# Patient Record
Sex: Male | Born: 2000 | Race: Black or African American | Hispanic: No | Marital: Single | Smoking: Current some day smoker
Health system: Southern US, Community
[De-identification: ages and names within clinical notes are randomized; demographics above are authoritative.]

## PROBLEM LIST (undated history)

## (undated) DIAGNOSIS — F909 Attention-deficit hyperactivity disorder, unspecified type: Secondary | ICD-10-CM

## (undated) DIAGNOSIS — F913 Oppositional defiant disorder: Secondary | ICD-10-CM

## (undated) DIAGNOSIS — J45909 Unspecified asthma, uncomplicated: Secondary | ICD-10-CM

## (undated) HISTORY — DX: Unspecified asthma, uncomplicated: J45.909

## (undated) HISTORY — DX: Attention-deficit hyperactivity disorder, unspecified type: F90.9

## (undated) HISTORY — PX: OTHER SURGICAL HISTORY: SHX169

## (undated) HISTORY — DX: Oppositional defiant disorder: F91.3

---
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
## 2021-09-30 DIAGNOSIS — F439 Reaction to severe stress, unspecified: Secondary | ICD-10-CM | POA: Diagnosis not present

## 2021-10-11 DIAGNOSIS — F439 Reaction to severe stress, unspecified: Secondary | ICD-10-CM | POA: Diagnosis not present

## 2021-11-04 DIAGNOSIS — F439 Reaction to severe stress, unspecified: Secondary | ICD-10-CM | POA: Diagnosis not present

## 2021-11-15 DIAGNOSIS — F439 Reaction to severe stress, unspecified: Secondary | ICD-10-CM | POA: Diagnosis not present

## 2022-05-02 ENCOUNTER — Ambulatory Visit
Admission: RE | Admit: 2022-05-02 | Discharge: 2022-05-02 | Disposition: A | Discharge status: Home / Self Care | Payer: Medicaid Other | Source: Ambulatory Visit | Attending: Family Medicine | Admitting: Family Medicine

## 2022-05-02 VITALS — BP 127/72 | HR 68 | Temp 97.2°F | Resp 16

## 2022-05-02 DIAGNOSIS — L42 Pityriasis rosea: Secondary | ICD-10-CM | POA: Diagnosis not present

## 2022-05-02 NOTE — ED Triage Notes (Signed)
Patient presents with UC with rash x 1 week. Pt states generalized rash, treating with calamine lotion.

## 2022-05-02 NOTE — ED Provider Notes (Signed)
[facility]    CSN: [id number] Arrival date & time: 05/02/22  1350      History   Chief Complaint Chief Complaint  Patient presents with   Rash   Urticaria    HPI Phillip Dixon is a 21 y.o. male.   Patient here today for evaluation of rash that he has had for the last week.  He is not sure if rash started on abdomen or back but states it is now spread to entire trunk and arms.  He notes that rash is itchy.  He has been using calamine lotion without resolution.  He was concerned that rash may be related to using Dawn dish soap to wash his body when he ran out of body wash.  The history is provided by the patient.  Rash Associated symptoms: no fever   Urticaria    Past Medical History:  Diagnosis Date   ADHD (attention deficit hyperactivity disorder)    Asthma    Oppositional defiant disorder     Patient Active Problem List   Diagnosis Date Noted   Abrasion of sclera 09/24/2018   Oppositional defiant disorder 04/28/2014   DENTAL PAIN 06/20/2010   ALLERGIC RHINITIS 04/22/2010   Asthma 04/14/2010   ADHD (attention deficit hyperactivity disorder), combined type 01/17/2009    Past Surgical History:  Procedure Laterality Date   tonsillectomy         Home Medications    Prior to Admission medications   Medication Sig Start Date End Date Taking? Authorizing Provider  hydroxypropyl methylcellulose / hypromellose (ISOPTO TEARS / GONIOVISC) 2.5 % ophthalmic solution Place 1 drop into the right eye as needed for dry eyes. 09/24/18   Shirley, [place], DO    Family History No family history on file.  Social History Social History   Tobacco Use   Smoking status: Some Days    Types: Cigarettes, Cigars   Smokeless tobacco: Never  Vaping Use   Vaping Use: Every day  Substance Use Topics   Alcohol use: Yes   Drug use: Yes    Types: Marijuana     Allergies   Patient has no known allergies.   Review of Systems Review of Systems   Constitutional:  Negative for chills and fever.  Eyes:  Negative for discharge and redness.  Skin:  Positive for rash.  Neurological:  Negative for numbness.     Physical Exam Triage Vital Signs ED Triage Vitals  Enc Vitals Group     BP 05/02/22 1424 127/72     Pulse Rate 05/02/22 1424 68     Resp 05/02/22 1424 16     Temp 05/02/22 1424 (!) 97.2 F (36.2 C)     Temp Source 05/02/22 1424 Oral     SpO2 05/02/22 1424 97 %     Weight --      Height --      Head Circumference --      Peak Flow --      Pain Score 05/02/22 1426 0     Pain Loc --      Pain Edu? --      Excl. in [place]? --    No data found.  Updated Vital Signs BP 127/72 (BP Location: Left Arm)   Pulse 68   Temp (!) 97.2 F (36.2 C) (Oral)   Resp 16   SpO2 97%      Physical Exam Vitals and nursing note reviewed.  Constitutional:      General: He is  not in acute distress.    Appearance: Normal appearance. He is not ill-appearing.  HENT:     Head: Normocephalic and atraumatic.  Eyes:     Conjunctiva/sclera: Conjunctivae normal.  Cardiovascular:     Rate and Rhythm: Normal rate.  Pulmonary:     Effort: Pulmonary effort is normal. No respiratory distress.  Skin:    Findings: Rash (diffuse mildly erythematous maculopapular rash with some dry appearing plaque-like lesions) present.  Neurological:     Mental Status: He is alert.  Psychiatric:        Mood and Affect: Mood normal.        Behavior: Behavior normal.        Thought Content: Thought content normal.      UC Treatments / Results  Labs (all labs ordered are listed, but only abnormal results are displayed) Labs Reviewed - No data to display  EKG   Radiology No results found.  Procedures Procedures (including critical care time)  Medications Ordered in UC Medications - No data to display  Initial Impression / Assessment and Plan / UC Course  I have reviewed the triage vital signs and the nursing notes.  Pertinent labs & imaging  results that were available during my care of the patient were reviewed by me and considered in my medical decision making (see chart for details).    Rash seemingly consistent with pityriasis rosea.  Discussed benign nature of same and recommended Benadryl if needed for itching.  Discussed that rash will clear without further treatment.  Encouraged follow-up with any further concerns.  Final Clinical Impressions(s) / UC Diagnoses   Final diagnoses:  Pityriasis rosea   Discharge Instructions   None    ED Prescriptions   None    PDMP not reviewed this encounter.   Tomi Bamberger, PA-C 05/02/22 [phone]

## 2023-07-09 ENCOUNTER — Ambulatory Visit: Payer: Self-pay | Admitting: Nurse Practitioner

## 2023-11-05 ENCOUNTER — Other Ambulatory Visit: Payer: Self-pay

## 2023-11-05 ENCOUNTER — Encounter (HOSPITAL_COMMUNITY): Payer: Self-pay

## 2023-11-05 ENCOUNTER — Emergency Department (EMERGENCY_DEPARTMENT_HOSPITAL)
Admission: EM | Admit: 2023-11-05 | Discharge: 2023-11-05 | Disposition: A | Discharge status: Other Acute Inpatient Hospital | Source: Home / Self Care | Attending: Emergency Medicine | Admitting: Emergency Medicine

## 2023-11-05 ENCOUNTER — Encounter (HOSPITAL_COMMUNITY): Payer: Self-pay | Admitting: Psychiatry

## 2023-11-05 ENCOUNTER — Inpatient Hospital Stay (HOSPITAL_COMMUNITY)
Admission: AD | Admit: 2023-11-05 | Discharge: 2023-11-08 | DRG: 885 | Disposition: A | Discharge status: Home / Self Care | Source: Intra-hospital | Attending: Psychiatry | Admitting: Psychiatry

## 2023-11-05 DIAGNOSIS — T39311A Poisoning by propionic acid derivatives, accidental (unintentional), initial encounter: Secondary | ICD-10-CM | POA: Insufficient documentation

## 2023-11-05 DIAGNOSIS — T887XXA Unspecified adverse effect of drug or medicament, initial encounter: Secondary | ICD-10-CM | POA: Diagnosis not present

## 2023-11-05 DIAGNOSIS — F902 Attention-deficit hyperactivity disorder, combined type: Secondary | ICD-10-CM | POA: Diagnosis present

## 2023-11-05 DIAGNOSIS — Y901 Blood alcohol level of 20-39 mg/100 ml: Secondary | ICD-10-CM | POA: Diagnosis present

## 2023-11-05 DIAGNOSIS — J45909 Unspecified asthma, uncomplicated: Secondary | ICD-10-CM | POA: Insufficient documentation

## 2023-11-05 DIAGNOSIS — F909 Attention-deficit hyperactivity disorder, unspecified type: Secondary | ICD-10-CM | POA: Insufficient documentation

## 2023-11-05 DIAGNOSIS — F329 Major depressive disorder, single episode, unspecified: Secondary | ICD-10-CM

## 2023-11-05 DIAGNOSIS — E876 Hypokalemia: Secondary | ICD-10-CM | POA: Diagnosis not present

## 2023-11-05 DIAGNOSIS — F10129 Alcohol abuse with intoxication, unspecified: Secondary | ICD-10-CM | POA: Diagnosis present

## 2023-11-05 DIAGNOSIS — F332 Major depressive disorder, recurrent severe without psychotic features: Secondary | ICD-10-CM

## 2023-11-05 DIAGNOSIS — Z5941 Food insecurity: Secondary | ICD-10-CM | POA: Diagnosis not present

## 2023-11-05 DIAGNOSIS — F10121 Alcohol abuse with intoxication delirium: Secondary | ICD-10-CM | POA: Diagnosis not present

## 2023-11-05 DIAGNOSIS — I10 Essential (primary) hypertension: Secondary | ICD-10-CM | POA: Diagnosis not present

## 2023-11-05 DIAGNOSIS — F1210 Cannabis abuse, uncomplicated: Secondary | ICD-10-CM | POA: Diagnosis present

## 2023-11-05 DIAGNOSIS — Z9152 Personal history of nonsuicidal self-harm: Secondary | ICD-10-CM

## 2023-11-05 DIAGNOSIS — T50904A Poisoning by unspecified drugs, medicaments and biological substances, undetermined, initial encounter: Secondary | ICD-10-CM

## 2023-11-05 DIAGNOSIS — Z79899 Other long term (current) drug therapy: Secondary | ICD-10-CM

## 2023-11-05 DIAGNOSIS — F411 Generalized anxiety disorder: Secondary | ICD-10-CM | POA: Diagnosis present

## 2023-11-05 DIAGNOSIS — Z5902 Unsheltered homelessness: Secondary | ICD-10-CM | POA: Diagnosis not present

## 2023-11-05 DIAGNOSIS — R9431 Abnormal electrocardiogram [ECG] [EKG]: Secondary | ICD-10-CM | POA: Diagnosis not present

## 2023-11-05 LAB — URINALYSIS, ROUTINE W REFLEX MICROSCOPIC
Bilirubin Urine: NEGATIVE
Glucose, UA: NEGATIVE mg/dL
Hgb urine dipstick: NEGATIVE
Ketones, ur: NEGATIVE mg/dL
Leukocytes,Ua: NEGATIVE
Nitrite: NEGATIVE
Protein, ur: NEGATIVE mg/dL
Specific Gravity, Urine: 1.013 (ref 1.005–1.030)
pH: 7 (ref 5.0–8.0)

## 2023-11-05 LAB — CBC WITH DIFFERENTIAL/PLATELET
Abs Immature Granulocytes: 0.02 10*3/uL (ref 0.00–0.07)
Basophils Absolute: 0 10*3/uL (ref 0.0–0.1)
Basophils Relative: 0 %
Eosinophils Absolute: 0.1 10*3/uL (ref 0.0–0.5)
Eosinophils Relative: 1 %
HCT: 44.8 % (ref 39.0–52.0)
Hemoglobin: 14.6 g/dL (ref 13.0–17.0)
Immature Granulocytes: 0 %
Lymphocytes Relative: 31 %
Lymphs Abs: 2.6 10*3/uL (ref 0.7–4.0)
MCH: 29.9 pg (ref 26.0–34.0)
MCHC: 32.6 g/dL (ref 30.0–36.0)
MCV: 91.6 fL (ref 80.0–100.0)
Monocytes Absolute: 0.7 10*3/uL (ref 0.1–1.0)
Monocytes Relative: 8 %
Neutro Abs: 5 10*3/uL (ref 1.7–7.7)
Neutrophils Relative %: 60 %
Platelets: 284 10*3/uL (ref 150–400)
RBC: 4.89 MIL/uL (ref 4.22–5.81)
RDW: 12.4 % (ref 11.5–15.5)
WBC: 8.4 10*3/uL (ref 4.0–10.5)
nRBC: 0 % (ref 0.0–0.2)

## 2023-11-05 LAB — COMPREHENSIVE METABOLIC PANEL WITH GFR
ALT: 14 U/L (ref 0–44)
AST: 21 U/L (ref 15–41)
Albumin: 4.2 g/dL (ref 3.5–5.0)
Alkaline Phosphatase: 68 U/L (ref 38–126)
Anion gap: 10 (ref 5–15)
BUN: 12 mg/dL (ref 6–20)
CO2: 22 mmol/L (ref 22–32)
Calcium: 8.7 mg/dL — ABNORMAL LOW (ref 8.9–10.3)
Chloride: 103 mmol/L (ref 98–111)
Creatinine, Ser: 0.75 mg/dL (ref 0.61–1.24)
GFR, Estimated: >60 mL/min (ref >60)
Glucose, Bld: 95 mg/dL (ref 70–99)
Potassium: 3.4 mmol/L — ABNORMAL LOW (ref 3.5–5.1)
Sodium: 135 mmol/L (ref 135–145)
Total Bilirubin: 0.8 mg/dL (ref 0.0–1.2)
Total Protein: 7.4 g/dL (ref 6.5–8.1)

## 2023-11-05 LAB — RAPID URINE DRUG SCREEN, HOSP PERFORMED
Amphetamines: NOT DETECTED
Barbiturates: NOT DETECTED
Benzodiazepines: NOT DETECTED
Cocaine: NOT DETECTED
Opiates: NOT DETECTED
Tetrahydrocannabinol: POSITIVE — AB

## 2023-11-05 LAB — SALICYLATE LEVEL: Salicylate Lvl: <7 mg/dL — ABNORMAL LOW (ref 7.0–30.0)

## 2023-11-05 LAB — ETHANOL: Alcohol, Ethyl (B): 30 mg/dL — ABNORMAL HIGH (ref <15)

## 2023-11-05 LAB — ACETAMINOPHEN LEVEL: Acetaminophen (Tylenol), Serum: <10 ug/mL — ABNORMAL LOW (ref 10–30)

## 2023-11-05 MED ORDER — NICOTINE 7 MG/24HR TD PT24
7.0000 mg | MEDICATED_PATCH | Freq: Every day | TRANSDERMAL | Status: DC
  Filled 2023-11-05: qty 1

## 2023-11-05 MED ORDER — DIPHENHYDRAMINE HCL 25 MG PO CAPS: 50.0000 mg | ORAL_CAPSULE | Freq: Three times a day (TID) | ORAL | Status: DC | PRN

## 2023-11-05 MED ORDER — HYDROXYZINE HCL 25 MG PO TABS: 25.0000 mg | ORAL_TABLET | Freq: Three times a day (TID) | ORAL | Status: DC | PRN

## 2023-11-05 MED ORDER — STERILE WATER FOR INJECTION IJ SOLN
INTRAMUSCULAR | Status: AC
  Filled 2023-11-05: qty 10

## 2023-11-05 MED ORDER — LORAZEPAM 2 MG/ML IJ SOLN: 2.0000 mg | Freq: Three times a day (TID) | INTRAMUSCULAR | Status: DC | PRN

## 2023-11-05 MED ORDER — HALOPERIDOL 5 MG PO TABS: 5.0000 mg | ORAL_TABLET | Freq: Three times a day (TID) | ORAL | Status: DC | PRN

## 2023-11-05 MED ORDER — ZIPRASIDONE MESYLATE 20 MG IM SOLR
INTRAMUSCULAR | Status: AC
  Filled 2023-11-05: qty 20

## 2023-11-05 MED ORDER — ALUM & MAG HYDROXIDE-SIMETH 200-200-20 MG/5ML PO SUSP: 30.0000 mL | Freq: Four times a day (QID) | ORAL | Status: DC | PRN

## 2023-11-05 MED ORDER — ACETAMINOPHEN 325 MG PO TABS
650.0000 mg | ORAL_TABLET | Freq: Four times a day (QID) | ORAL | Status: DC | PRN
  Administered 2023-11-05: 650 mg via ORAL
  Filled 2023-11-05: qty 2

## 2023-11-05 MED ORDER — ACETAMINOPHEN 325 MG PO TABS: 650.0000 mg | ORAL_TABLET | ORAL | Status: DC | PRN

## 2023-11-05 MED ORDER — POTASSIUM CHLORIDE CRYS ER 20 MEQ PO TBCR
40.0000 meq | EXTENDED_RELEASE_TABLET | Freq: Once | ORAL | Status: AC
  Administered 2023-11-05: 40 meq via ORAL
  Filled 2023-11-05: qty 2

## 2023-11-05 MED ORDER — DIPHENHYDRAMINE HCL 50 MG/ML IJ SOLN: 50.0000 mg | Freq: Three times a day (TID) | INTRAMUSCULAR | Status: DC | PRN

## 2023-11-05 MED ORDER — ZIPRASIDONE MESYLATE 20 MG IM SOLR
10.0000 mg | Freq: Once | INTRAMUSCULAR | Status: AC | PRN
  Administered 2023-11-05: 10 mg via INTRAMUSCULAR

## 2023-11-05 MED ORDER — ALUM & MAG HYDROXIDE-SIMETH 200-200-20 MG/5ML PO SUSP: 30.0000 mL | ORAL | Status: DC | PRN

## 2023-11-05 MED ORDER — TRAZODONE HCL 50 MG PO TABS
50.0000 mg | ORAL_TABLET | Freq: Every evening | ORAL | Status: DC | PRN
  Administered 2023-11-05 – 2023-11-07 (×2): 50 mg via ORAL
  Filled 2023-11-05 (×2): qty 1

## 2023-11-05 MED ORDER — HYDROXYZINE HCL 25 MG PO TABS
25.0000 mg | ORAL_TABLET | Freq: Three times a day (TID) | ORAL | Status: DC | PRN
  Administered 2023-11-05: 25 mg via ORAL
  Filled 2023-11-05: qty 1

## 2023-11-05 MED ORDER — MAGNESIUM HYDROXIDE 400 MG/5ML PO SUSP: 30.0000 mL | Freq: Every day | ORAL | Status: DC | PRN

## 2023-11-05 MED ORDER — HALOPERIDOL LACTATE 5 MG/ML IJ SOLN: 5.0000 mg | Freq: Three times a day (TID) | INTRAMUSCULAR | Status: DC | PRN

## 2023-11-05 MED ORDER — ONDANSETRON HCL 4 MG PO TABS: 4.0000 mg | ORAL_TABLET | Freq: Three times a day (TID) | ORAL | Status: DC | PRN

## 2023-11-05 MED ORDER — ZIPRASIDONE MESYLATE 20 MG IM SOLR: 10.0000 mg | Freq: Once | INTRAMUSCULAR | Status: DC | PRN

## 2023-11-05 MED ORDER — HALOPERIDOL LACTATE 5 MG/ML IJ SOLN: 10.0000 mg | Freq: Three times a day (TID) | INTRAMUSCULAR | Status: DC | PRN

## 2023-11-05 NOTE — ED Notes (Signed)
 Writer rounded on patient. Pt asleep. Respirations even and unlabored.

## 2023-11-05 NOTE — Consult Note (Signed)
 [facility] Psychiatric Consult Initial  Patient Name: .Phillip Dixon  MRN: [id number]  DOB: May 08, 2001  Consult Order details:  Orders (From admission, onward)     Start     Ordered   11/05/23 0211  CONSULT TO CALL ACT TEAM       Ordering Provider: Alissa April, MD  Provider:  (Not yet assigned)  Question:  Reason for Consult?  Answer:  Psych consult   11/05/23 0210             Mode of Visit: In person    Psychiatry Consult Evaluation  Service Date: Dixon 24, 2025 LOS:  LOS: 0 days  Chief Complaint "patient took 200 mg ibuprofen, and some tylenol ."   Primary Psychiatric Diagnoses  Major depressive disorder, unspecified 2.   GAD 3.   ADHD  Assessment  Phillip Dixon is a 23 y.o. male admitted: Presented to the ED on 11/05/2023 12:30 AM for overdose. He carries the psychiatric diagnoses of MDD, GAD, and ADHD and has a past medical history of none.   His current presentation of anhedonia, aggression, impulsivity, and hopelessness is most consistent with decompensating ADHD and depression. He meets criteria for inpatient admission based on the current symptoms.  Current outpatient psychotropic medications include none. On initial examination, patient is cooperative, but focused on being discharged. Please see plan below for detailed recommendations.   Diagnoses:  Active Hospital problems: Principal Problem:   MDD (major depressive disorder) Active Problems:   ADHD (attention deficit hyperactivity disorder), combined type    Plan   ## Psychiatric Medication Recommendations:  Give Atarax  25 mg PO TID PRN for anxiety  ## Medical Decision Making Capacity: Not specifically addressed in this encounter  ## Further Work-up:  -- No further work up needed at this time  EKG or UDS -- most recent EKG on 11/05/23 had QtC of 417 -- Pertinent labwork reviewed earlier this admission includes: CBC, CMP, EKG   ## Disposition:-- We recommend inpatient psychiatric  hospitalization after medical hospitalization. Patient has been involuntarily committed on 11/05/23.   ## Behavioral / Environmental: -To minimize splitting of staff, assign one staff person to communicate all information from the team when feasible. or Utilize compassion and acknowledge the patient's experiences while setting clear and realistic expectations for care.    ## Safety and Observation Level:  - Based on my clinical evaluation, I estimate the patient to be at low risk of self harm in the current setting. - At this time, we recommend  routine. This decision is based on my review of the chart including patient's history and current presentation, interview of the patient, mental status examination, and consideration of suicide risk including evaluating suicidal ideation, plan, intent, suicidal or self-harm behaviors, risk factors, and protective factors. This judgment is based on our ability to directly address suicide risk, implement suicide prevention strategies, and develop a safety plan while the patient is in the clinical setting. Please contact our team if there is a concern that risk level has changed.  CSSR Risk Category:C-SSRS RISK CATEGORY: Low Risk  Suicide Risk Assessment: Patient has following modifiable risk factors for suicide: active suicidal ideation, untreated depression, and recklessness, which we are addressing by recommending inpatient psychiatric admission. Patient has following non-modifiable or demographic risk factors for suicide: male gender, history of suicide attempt, and history of self harm behavior Patient has the following protective factors against suicide: Supportive friends  Thank you for this consult request. Recommendations have been communicated to the primary  team.  We will recommend inpatient psychiatric admission at this time.   Phillip Dixon, PMHNP       History of Present Illness  Relevant Aspects of Hospital ED Course:  Admitted on  11/05/2023 for attempting overdose patient took 200 mg ibuprofen, and some tylenol ."   Patient Report:  Phillip Dixon, 23 y.o., male patient seen face to face by this provider, consulted with Dr. Deborah Dixon; and chart reviewed on 11/05/23.  On evaluation Phillip Dixon reports that he was not trying to harm himself, but states he was trying to "numb the pain" of his girlfriend breaking up with him. Patient girlfriend Phillip Dixon is here visiting patient. Patient becomes upset when this provider informs him, he is recommended for inpatient admission, based on the attempted overdose. Patient is focused on being discharged, and leaving the hospital, as he states he has a job he has to get back to. This provider discusses with him consequences of the actions we make, patient continues to say he just wanted to numb the pain, and states he takes Tylenol  all the time, but could not say why he was taking Tylenol . Patient states he is homeless, and he needs his job, he confirms he works part time at [employer]. He states his appetite and sleep are fair. Denies ever being admitted to an inpatient psychiatric facility, but states he has seen a therapist before (years ago) and was diagnosed with depression and ADHD. Patient states he does not take medication, and says when he took medication for ADHD it made him lose his appetite and he did not continue to take it.   During evaluation Phillip Dixon is in the milieu sitting in a chair, talking and appears to be in no acute distress. He is alert, oriented x 3, calm, cooperative and attentive. Her mood is irritable, but able to calm down throughout the assessment with congruent affect.  He has normal speech, and behavior.  Objectively there is no evidence of psychosis/mania or delusional thinking.  Patient is able to converse coherently, goal directed thoughts, no distractibility, or pre-occupation. He currently denies suicidal/self-harm/homicidal ideation, psychosis, and paranoia.   Per Chart Review patient was seen in the ED in 07/2016 due to SI and HI thoughts. Patient has a past hx of cutting. Patient UDS positive for THC and BAL is 30 on admission.    Psych ROS:  Depression: Positive  Anxiety:  Positive  Mania (lifetime and current): Denies  Psychosis: (lifetime and current): Denies   Collateral information:  With patient permission, spoke with his girlfriend [name], she states she has known patient about 8 yrs since high school, says he has always been very hyper, irritable, trouble focusing, and depressed for a very long time. She agrees that patient needs help with mental health treatment. She states he has been homeless, she lives with her parents and he sleeps in her car, and he drinks alcohol a lot, while taking over the counter pain medications, and THC. She states he was raised by his paternal aunt [name], his biological mother lives in [place], and his bio father lives here in [place] in a boarding house. She states that patients family does not believe in mental health help or treatment. She states the only real support patient had in his family was his paternal grandfather who passed away when patient was 8 yrs old. She states he face timed her last night, and was walking in traffic after she broke up with him, because she states  they need to focus on self, and get individual help, she feels she has enabled him and he wont let her break up with him.    Review of Systems  Psychiatric/Behavioral:  Positive for depression, substance abuse and suicidal ideas.      Psychiatric and Social History  Psychiatric History:  Information collected from patient girlfriend Phillip Dixon  Prev Dx/Sx: depression and anxiety  Current Psych Provider: None Home Meds (current): None Previous Med Trials: None Therapy: years ago  Prior Psych Hospitalization: Denies   Prior Self Harm: Yes Prior Violence: Yes  Family Psych History: Yes Family Hx suicide: Denies    Social History:  Developmental Hx: deferred  Educational Hx: Patient graduated high school Occupational Hx: Part time at [employer] Hx: Denies  Living Situation: Homeless Spiritual Hx: Yes Access to weapons/lethal means: Denies    Substance History Alcohol: Yes  Type of alcohol liquor Last Drink yesterday  Number of drinks per day unknown  History of alcohol withdrawal seizures Denies  History of DT's Denies  Tobacco: Yes Illicit drugs: THC Prescription drug abuse: Denies  Rehab hx: Denies   Exam Findings  Physical Exam:  Vital Signs:  Temp:  [97.6 F (36.4 C)-97.9 F (36.6 C)] 97.6 F (36.4 C) (04/24 1243) Pulse Rate:  [60-77] 60 (04/24 1243) Resp:  [16] 16 (04/24 1243) BP: (112-150)/(57-86) 112/57 (04/24 1243) SpO2:  [98 %-100 %] 100 % (04/24 1243) Weight:  [72.6 kg] 72.6 kg (04/24 0049) Blood pressure (!) 112/57, pulse 60, temperature 97.6 F (36.4 C), temperature source Oral, resp. rate 16, height 6\' 1"  (1.854 m), weight 72.6 kg, SpO2 100%. Body mass index is 21.11 kg/m.  Physical Exam Vitals and nursing note reviewed. Exam conducted with a chaperone present.  Neurological:     Mental Status: He is alert.  Psychiatric:        Mood and Affect: Mood is anxious.        Speech: Speech normal.        Behavior: Behavior is agitated.        Judgment: Judgment is impulsive and inappropriate.     Mental Status Exam: General Appearance: Casual  Orientation:  Full (Time, Place, and Person)  Memory:  Immediate;   Fair Remote;   Fair  Concentration:  Concentration: Fair and Attention Span: Fair  Recall:  Fair  Attention  Fair  Eye Contact:  Fair  Speech:  Clear and Coherent  Language:  Fair  Volume:  Normal  Mood: sad  Affect:  Appropriate  Thought Process:  Coherent  Thought Content:  Logical  Suicidal Thoughts:  No  Homicidal Thoughts:  No  Judgement:  Intact  Insight:  Fair  Psychomotor Activity:  Normal  Akathisia:  N/A  Fund of Knowledge:   Fair      Assets:  Manufacturing systems engineer Desire for Improvement Social Support  Cognition:  WNL  ADL's:  Intact  AIMS (if indicated):        Other History   These have been pulled in through the EMR, reviewed, and updated if appropriate.  Family History:  The patient's family history is not on file.  Medical History: Past Medical History:  Diagnosis Date   ADHD (attention deficit hyperactivity disorder)    Asthma    Oppositional defiant disorder     Surgical History: Past Surgical History:  Procedure Laterality Date   tonsillectomy       Medications:   Current Facility-Administered Medications:    acetaminophen  (TYLENOL ) tablet 650 mg, 650  mg, Oral, Q4H PRN, Phillip April, MD   alum & mag hydroxide-simeth (MAALOX/MYLANTA) 200-200-20 MG/5ML suspension 30 mL, 30 mL, Oral, Q6H PRN, Phillip April, MD   nicotine  (NICODERM CQ  - dosed in mg/24 hr) patch 7 mg, 7 mg, Transdermal, Daily, Phillip April, MD   ondansetron  (ZOFRAN ) tablet 4 mg, 4 mg, Oral, Q8H PRN, Phillip April, MD   ziprasidone  (GEODON ) injection 10 mg, 10 mg, Intramuscular, Once PRN, Phillip April, MD  Current Outpatient Medications:    HYDROcodone-acetaminophen  (NORCO/VICODIN) 5-325 MG tablet, Take by mouth., Disp: , Rfl:    hydroxypropyl methylcellulose / hypromellose (ISOPTO TEARS / GONIOVISC) 2.5 % ophthalmic solution, Place 1 drop into the right eye as needed for dry eyes., Disp: 15 mL, Rfl: 12  Allergies: No Known Allergies  Jaevian Shean Dixon, PMHNP

## 2023-11-05 NOTE — Plan of Care (Signed)
  Problem: Education: Goal: Knowledge of [facility] Education information/materials will improve Outcome: Progressing Goal: Verbalization of understanding the information provided will improve Outcome: Progressing   Problem: Safety: Goal: Periods of time without injury will increase Outcome: Progressing   

## 2023-11-05 NOTE — ED Notes (Signed)
 Pt provided with [place] sandwich.

## 2023-11-05 NOTE — ED Notes (Signed)
 [facility] received a communication from the [facility] social worker that pts grandmother Blane Bunting) was requesting a call back to get information about pt. [facility] spoke with pt to get permission to speak with his grandmother and pt agreed. [facility] called pts grandmother, responding to her inquiry. Pts grandmother's phone went straight to voicemail. [facility] left a HIPAA compliant message to return the call.   Patt Boozer, [facility]  11/05/23

## 2023-11-05 NOTE — ED Notes (Signed)
 Pt is denying vitals. Will attempt again.

## 2023-11-05 NOTE — ED Notes (Signed)
 Pt threatening writer saying "me and my dad are going to show you how we jump somebody"

## 2023-11-05 NOTE — Tx Team (Signed)
 Initial Treatment Plan 11/05/2023 11:41 PM [name]    PATIENT STRESSORS: Financial difficulties   Other: Being Homeless     PATIENT STRENGTHS: Capable of independent living  General fund of knowledge  Supportive family/friends    PATIENT IDENTIFIED PROBLEMS: Risk of SI  ADHD  "Being Homeless"  "Financial Situation"               DISCHARGE CRITERIA:  Adequate post-discharge living arrangements Improved stabilization in mood, thinking, and/or behavior  PRELIMINARY DISCHARGE PLAN: Return to previous work or school arrangements  PATIENT/FAMILY INVOLVEMENT: This treatment plan has been presented to and reviewed with the patient, Phillip Dixon, and/or family member.  The patient and family have been given the opportunity to ask questions and make suggestions.  Khrystal Jeanmarie M Anyae Griffith, RN 11/05/2023, 11:41 PM

## 2023-11-05 NOTE — ED Notes (Signed)
 Pt brought over to [facility]. Pt immediately came up to writer and then stated "if I dont get my phone then I'm going to cause problems". Writer attempted to explain to patient that all belongings are collected and then given back to patient at time of discharge. Pt continuing to be argumentative and agitated. Writer messaged Dr. Candelaria Chaco notifying of agitation.

## 2023-11-05 NOTE — ED Notes (Signed)
 [facility] received a call from pts grandmother, Blane Bunting to get an update on pt. [facility] explained to pts grandmother that pt has been recommended for inpatient treatment and will transferred to [facility] later today. Pts grandmother said that pt has burned bridges in his life and is not welcome to live with family members. Pt has acted disrespectfully and had little regard for their homes. Pts grandmother feels that pt "needs to grow up and move on". [facility] assured pts grandmother that pt will receive treatment and medication while he is inpatient and be referred at discharge to follow up outpatient treatment and therapy. Pts grandmother was appreciative of the update.  Patt Boozer, [facility]  11/05/23

## 2023-11-05 NOTE — BH Assessment (Signed)
 TTS attempted to assess pt. [name],RN informed TTS that pt received Geodon  due to aggressive behaviors. Pt is currently sedated and will be seen next shift.

## 2023-11-05 NOTE — Progress Notes (Signed)
 Pt has been accepted to [facility] on 11/05/2023 after 2000 Tonight.  Bed assignment: 401-2   Pt meets inpatient criteria per: Chandra Come, PMHNP   Attending Physician will be: Dr. Zouev MD   Report can be called to: Adult unit: [phone]  Pt can arrive after 2000 [facility] will update   Care Team Notified: [facility]  Bevin Bucks RN, Patt Boozer NT, April Wilson paramedic, Chandra Come, PMHNP    [place] Nishi Neiswonger LCSW-A   11/05/2023 2:31 PM

## 2023-11-05 NOTE — ED Notes (Signed)
 Writer called to set up police transport to [facility]

## 2023-11-05 NOTE — ED Notes (Signed)
 Pt cleared by poison control at this time. No further recommendations.

## 2023-11-05 NOTE — ED Notes (Signed)
 Writer attempted to call to give report on patient being set up for transport to go to [facility] at 8pm. Writer was asked to call back in a few minutes to allow dayshift to finish giving report.

## 2023-11-05 NOTE — ED Provider Notes (Signed)
 [place] EMERGENCY DEPARTMENT AT [facility] Provider Note   CSN: [id number] Arrival date & time: 11/05/23  0030     History  Chief Complaint  Patient presents with   Drug Overdose   Suicide Attempt    Phillip Dixon is a 23 y.o. male.  The history is provided by the patient and the EMS personnel.  Drug Overdose  He has history of asthma, attention deficit disorder, oppositional defiant disorder came in by ambulance after taking about 10 ibuprofen tablets.  He states that he had broken up with his girlfriend earlier today and was feeling very sad and just wanted to feel numb.  He denies suicidal intent.  He does admit to drinking some alcohol but denies other drug use.  He does admit to smoking marijuana sometimes.  He admits to crying spells and anhedonia but denies early morning awakening.  He denies hallucinations.  He states he wants to leave because he does not feel he can afford to miss any work.   Home Medications Prior to Admission medications   Medication Sig Start Date End Date Taking? Authorizing Provider  hydroxypropyl methylcellulose / hypromellose (ISOPTO TEARS / GONIOVISC) 2.5 % ophthalmic solution Place 1 drop into the right eye as needed for dry eyes. 09/24/18   Shirley, [place], DO      Allergies    Patient has no known allergies.    Review of Systems   Review of Systems  All other systems reviewed and are negative.   Physical Exam Updated Vital Signs BP (!) 150/86   Pulse 77   Temp 97.9 F (36.6 C) (Oral)   Resp 16   Ht 6\' 1"  (1.854 m)   Wt 72.6 kg   SpO2 98%   BMI 21.11 kg/m  Physical Exam Vitals and nursing note reviewed.   23 year old male, resting comfortably and in no acute distress. Vital signs are significant for elevated blood pressure. Oxygen saturation is 98%, which is normal. Head is normocephalic and atraumatic. PERRLA, EOMI.  Lungs are clear without rales, wheezes, or rhonchi. Chest is nontender. Heart has regular  rate and rhythm without murmur. Abdomen is soft, flat, nontender. Skin is warm and dry without rash. Neurologic: Awake and alert but with markedly depressed affect.  Cranial nerves are intact, moves all extremities equally.  ED Results / Procedures / Treatments   Labs (all labs ordered are listed, but only abnormal results are displayed) Labs Reviewed  COMPREHENSIVE METABOLIC PANEL WITH GFR - Abnormal; Notable for the following components:      Result Value   Potassium 3.4 (*)    Calcium 8.7 (*)    All other components within normal limits  ETHANOL - Abnormal; Notable for the following components:   Alcohol, Ethyl (B) 30 (*)    All other components within normal limits  ACETAMINOPHEN  LEVEL - Abnormal; Notable for the following components:   Acetaminophen  (Tylenol ), Serum <10 (*)    All other components within normal limits  SALICYLATE LEVEL - Abnormal; Notable for the following components:   Salicylate Lvl <7.0 (*)    All other components within normal limits  RAPID URINE DRUG SCREEN, HOSP PERFORMED - Abnormal; Notable for the following components:   Tetrahydrocannabinol POSITIVE (*)    All other components within normal limits  CBC WITH DIFFERENTIAL/PLATELET  URINALYSIS, ROUTINE W REFLEX MICROSCOPIC    EKG EKG Interpretation Date/Time:  Thursday November 05 2023 00:38:38 EDT Ventricular Rate:  73 PR Interval:  220 QRS Duration:  91 QT Interval:  378 QTC Calculation: 417 R Axis:   86  Text Interpretation: Sinus rhythm Prolonged PR interval Biatrial enlargement Nonspecific T abnrm, anterolateral leads ST elev, probable normal early repol pattern No old tracing to compare Confirmed by Alissa April ([id number]) on 11/05/2023 1:07:02 AM  Radiology No results found.  Procedures Procedures  Cardiac monitor shows normal sinus rhythm, per my interpretation.  Medications Ordered in ED Medications  ondansetron  (ZOFRAN ) tablet 4 mg (has no administration in time range)  acetaminophen   (TYLENOL ) tablet 650 mg (has no administration in time range)  alum & mag hydroxide-simeth (MAALOX/MYLANTA) 200-200-20 MG/5ML suspension 30 mL (has no administration in time range)  nicotine  (NICODERM CQ  - dosed in mg/24 hr) patch 7 mg (has no administration in time range)  sterile water  (preservative free) injection (  Not Given 11/05/23 0417)  ziprasidone  (GEODON ) injection 10 mg (has no administration in time range)  potassium chloride  SA (KLOR-CON  M) CR tablet 40 mEq (40 mEq Oral Given 11/05/23 0222)  ziprasidone  (GEODON ) injection 10 mg (10 mg Intramuscular Given 11/05/23 0352)  sterile water  (preservative free) injection (  Given 11/05/23 0417)    ED Course/ Medical Decision Making/ A&P                                 Medical Decision Making Amount and/or Complexity of Data Reviewed Labs: ordered.  Risk OTC drugs. Prescription drug management.   Overdose of ibuprofen with patient denying suicidal intent but I am still concerned about possible suicidal ideation.  He is currently about 3 hours postingestion.  I have ordered laboratory workup of CBC, competence of metabolic panel, ethanol level, acetaminophen  and salicylate levels, urine drug screen.  I have reviewed his past records and note ED visit on 08/08/2016 for suicidal ideation.  I have discussed the case with [place]  poison control who agree with the workup initiated.  If acetaminophen  and salicylate are undetectable, no need for repeat levels.  He will need TTS evaluation once he is medically cleared.  7:00 AM I have reviewed his laboratory tests, my interpretation is undetectable acetaminophen  and salicylate, ethanol level well below the level of legal intoxication, borderline hypokalemia and otherwise normal comprehensive metabolic panel, normal CBC, normal urinalysis, drug screen positive only for tetrahydrocannabinol.  I have ordered a dose of oral potassium.  At this point, he is medically clear for psychiatric  evaluation and treatment.  I have ordered a TTS consultation.  Patient became very agitated and combative and required ziprasidone  for sedation.  Sedation was necessary for patient and staff safety.  Following ziprasidone , patient is resting comfortably.  TTS consultation is pending.  CRITICAL CARE Performed by: Alissa April Total critical care time: 40 minutes Critical care time was exclusive of separately billable procedures and treating other patients. Critical care was necessary to treat or prevent imminent or life-threatening deterioration. Critical care was time spent personally by me on the following activities: development of treatment plan with patient and/or surrogate as well as nursing, discussions with consultants, evaluation of patient's response to treatment, examination of patient, obtaining history from patient or surrogate, ordering and performing treatments and interventions, ordering and review of laboratory studies, ordering and review of radiographic studies, pulse oximetry and re-evaluation of patient's condition.  Final Clinical Impression(s) / ED Diagnoses Final diagnoses:  Drug overdose of undetermined intent, initial encounter  Severe episode of recurrent major depressive disorder, without psychotic features (HCC)  Hypokalemia    Rx / DC Orders ED Discharge Orders     None         Alissa April, MD 11/05/23 0700

## 2023-11-05 NOTE — ED Notes (Signed)
 Pt ambulated with steady gait to bathroom and provided urine sample. Pt was dressed out into purple scrubs, and belongings were placed in pt belonging bag, labeled, and placed in cabinet at nurses station

## 2023-11-05 NOTE — ED Notes (Signed)
 Pt refused vitals

## 2023-11-05 NOTE — ED Notes (Signed)
 Writer rounded on patient. Respirations even and unlabored.

## 2023-11-05 NOTE — ED Notes (Signed)
 Pt laid down in bed on his own. No need for physical restraints at this time.

## 2023-11-05 NOTE — ED Notes (Signed)
 Security called to help deescalate patient.

## 2023-11-05 NOTE — ED Notes (Signed)
 Pt threatening to "smash shit up"

## 2023-11-05 NOTE — ED Provider Notes (Signed)
 Emergency Medicine Observation Re-evaluation Note  Phillip Dixon is a 23 y.o. male, seen on rounds today.  Pt initially presented to the ED for complaints of Drug Overdose and Suicide Attempt Currently, the patient is resting.  Physical Exam  BP (!) 150/86   Pulse 77   Temp 97.9 F (36.6 C) (Oral)   Resp 16   Ht 6\' 1"  (1.854 m)   Wt 72.6 kg   SpO2 98%   BMI 21.11 kg/m  Physical Exam General: NAD   ED Course / MDM  EKG:EKG Interpretation Date/Time:  Thursday November 05 2023 00:38:38 EDT Ventricular Rate:  73 PR Interval:  220 QRS Duration:  91 QT Interval:  378 QTC Calculation: 417 R Axis:   86  Text Interpretation: Sinus rhythm Prolonged PR interval Biatrial enlargement Nonspecific T abnrm, anterolateral leads ST elev, probable normal early repol pattern No old tracing to compare Confirmed by Alissa April ([id number]) on 11/05/2023 1:07:02 AM  I have reviewed the labs performed to date as well as medications administered while in observation.  Recent changes in the last 24 hours include no acute events reported.  Plan  Current plan is for placement.    Burnette Carte, MD 11/05/23 [phone]

## 2023-11-05 NOTE — ED Triage Notes (Signed)
 BIB EMS from [employer] bus stop. Friend called it in as an overdose, pt was laying on ground. Pt reports he only took ten 200 mg ibuprofen, and some tylenol . Pt denies SI with EMS. Pt denied a line or any treatment with EMS.   Pt reports he took the meds to "make the mental pain stop, I was sad and I thought the medicine would numb it" but he's "too scared to kill himself"  Pts girlfriend told [employer] that pt was drinking and took 30 ibuprofen and tylenol , and that he was suicidal.

## 2023-11-06 ENCOUNTER — Encounter (HOSPITAL_COMMUNITY): Payer: Self-pay

## 2023-11-06 DIAGNOSIS — F10129 Alcohol abuse with intoxication, unspecified: Secondary | ICD-10-CM | POA: Insufficient documentation

## 2023-11-06 DIAGNOSIS — F329 Major depressive disorder, single episode, unspecified: Secondary | ICD-10-CM | POA: Diagnosis not present

## 2023-11-06 DIAGNOSIS — F10121 Alcohol abuse with intoxication delirium: Secondary | ICD-10-CM | POA: Diagnosis not present

## 2023-11-06 MED ORDER — MIRTAZAPINE 15 MG PO TBDP
15.0000 mg | ORAL_TABLET | Freq: Every day | ORAL | Status: DC
  Administered 2023-11-06 – 2023-11-07 (×2): 15 mg via ORAL
  Filled 2023-11-06 (×5): qty 1

## 2023-11-06 NOTE — Progress Notes (Signed)
   11/05/23 2322  Psych Admission Type (Psych Patients Only)  Admission Status Involuntary  Psychosocial Assessment  Patient Complaints Irritability;Worrying;Agitation  Eye Contact Fair  Facial Expression Worried;Angry  Affect Irritable;Angry  Speech Logical/coherent  Interaction Hostile (...but cooperates)  Motor Activity Fidgety;Restless  Appearance/Hygiene Disheveled;In scrubs  Behavior Characteristics Cooperative;Irritable;Agressive verbally  Mood Irritable;Anxious  Thought Process  Coherency Circumstantial  Content Blaming others  Delusions None reported or observed  Perception WDL  Hallucination None reported or observed  Judgment Poor  Confusion None  Danger to Self  Current suicidal ideation? Denies  Agreement Not to Harm Self Yes  Description of Agreement Verbal  Danger to Others  Danger to Others None reported or observed

## 2023-11-06 NOTE — Plan of Care (Signed)
 Pt presents with irritable/agitated affect and anxious mood. Denies SI, HI, AVH, and pain. Reports good sleep and appetite. Pt is isolative to room and does not attend groups. Safety checks maintained at q 15 minutes. Support, encouragement, and reassurance offered to the pt.   Problem: Education: Goal: Emotional status will improve Outcome: Progressing Goal: Mental status will improve Outcome: Progressing Goal: Verbalization of understanding the information provided will improve Outcome: Progressing   Problem: Safety: Goal: Periods of time without injury will increase Outcome: Progressing   Problem: Activity: Goal: Interest or engagement in activities will improve Outcome: Not Progressing   Problem: Coping: Goal: Ability to verbalize frustrations and anger appropriately will improve Outcome: Not Progressing

## 2023-11-06 NOTE — Group Note (Signed)
 Date:  11/06/2023 Time:  1:49 PM  Group Topic/Focus:  Healthy Communication:   The focus of this group is to discuss boundaries and healthy ways to communicate with others.    Participation Level:  Did Not Attend   Sheryl Donna 11/06/2023, 1:49 PM
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
## 2023-11-06 NOTE — [facility] Suicide Risk Assessment (Signed)
 [facility] Admission Suicide Risk Assessment   Nursing information obtained from:  Patient Demographic factors:  Male, Low socioeconomic status Current Mental Status:  NA Loss Factors:  Financial problems / change in socioeconomic status Historical Factors:  Impulsivity Risk Reduction Factors:  Positive social support  Total Time spent with patient: 30 minutes Principal Problem: MDD (major depressive disorder) Diagnosis:  Principal Problem:   MDD (major depressive disorder)  Subjective Data: see H&P  Continued Clinical Symptoms:  Alcohol Use Disorder Identification Test Final Score (AUDIT): 3 The "Alcohol Use Disorders Identification Test", Guidelines for Use in Primary Care, Second Edition.  World Science writer [facility]). Score between 0-7:  no or low risk or alcohol related problems. Score between 8-15:  moderate risk of alcohol related problems. Score between 16-19:  high risk of alcohol related problems. Score 20 or above:  warrants further diagnostic evaluation for alcohol dependence and treatment.   CLINICAL FACTORS:   Depression:   Comorbid alcohol abuse/dependence Impulsivity Alcohol/Substance Abuse/Dependencies   Musculoskeletal: Strength & Muscle Tone: within normal limits Gait & Station: normal Patient leans: N/A  Psychiatric Specialty Exam:  Presentation  General Appearance:  Appropriate for Environment  Eye Contact: Good  Speech: Clear and Coherent  Speech Volume: Normal  Handedness:No data recorded  Mood and Affect  Mood: Anxious; Irritable  Affect: Congruent   Thought Process  Thought Processes: Coherent  Descriptions of Associations:Intact  Orientation:Full (Time, Place and Person)  Thought Content:WDL  History of Schizophrenia/Schizoaffective disorder:No data recorded Duration of Psychotic Symptoms:No data recorded Hallucinations:Hallucinations: None  Ideas of Reference:None  Suicidal Thoughts:Suicidal Thoughts: No  Homicidal  Thoughts:Homicidal Thoughts: No   Sensorium  Memory: Immediate Fair  Judgment: Fair  Insight: Fair   Art therapist  Concentration: Fair  Attention Span: Fair  Recall: [employer] of Knowledge: Fair  Language: Fair   Psychomotor Activity  Psychomotor Activity: Psychomotor Activity: Normal   Assets  Assets: Communication Skills; Desire for Improvement; Financial Resources/Insurance; Physical Health   Sleep  Sleep: Sleep: Fair    Physical Exam: Physical Exam ROS Blood pressure (!) 136/93, pulse 69, temperature 97.9 F (36.6 C), temperature source Oral, resp. rate 16, height 6\' 1"  (1.854 m), weight 66.7 kg, SpO2 98%. Body mass index is 19.39 kg/m.   COGNITIVE FEATURES THAT CONTRIBUTE TO RISK:  None    SUICIDE RISK:   Severe:  Frequent, intense, and enduring suicidal ideation, specific plan, no subjective intent, but some objective markers of intent (i.e., choice of lethal method), the method is accessible, some limited preparatory behavior, evidence of impaired self-control, severe dysphoria/symptomatology, multiple risk factors present, and few if any protective factors, particularly a lack of social support.  PLAN OF CARE: admit to inpatient psychiatry  I certify that inpatient services furnished can reasonably be expected to improve the patient's condition.   Kojo Liby, MD 11/06/2023, 2:11 PM

## 2023-11-06 NOTE — [facility] Group Notes (Signed)
 [facility] Group Notes:  (Nursing/MHT/Case Management/Adjunct)  Date:  11/06/2023  Time: 2000  Type of Therapy:   Alcoholics Anonymous Meeting  Participation Level:  Active  Participation Quality:  Appropriate, Attentive, and Sharing  Affect:  Irritable  Cognitive:  Alert  Insight:  Lacking  Engagement in Group:  Engaged  Modes of Intervention:  Clarification, Education, and Support  Summary of Progress/Problems:  Phillip Dixon 11/06/2023, 9:56 PM

## 2023-11-06 NOTE — Progress Notes (Signed)
 Patient alert and oriented. Denies SI, HI, AVH, and pain. Patient denies anxiety and depression at this time. Scheduled medications administered to patient, per provider orders. Support and encouragement provided. Routine safety checks conducted every 15 minutes. Patient verbally contracts for safety and remains safe on the unit.    11/06/23 2148  Psych Admission Type (Psych Patients Only)  Admission Status Involuntary  Psychosocial Assessment  Patient Complaints Irritability;Restlessness  Eye Contact Fair  Facial Expression Anxious  Affect Irritable  Speech Logical/coherent  Interaction Assertive  Motor Activity Fidgety  Appearance/Hygiene Unremarkable  Behavior Characteristics Cooperative  Mood Anxious;Irritable  Thought Process  Coherency Circumstantial  Content Blaming others  Delusions None reported or observed  Perception WDL  Hallucination None reported or observed  Judgment Poor  Confusion None  Danger to Self  Current suicidal ideation? Denies  Agreement Not to Harm Self Yes  Description of Agreement verbal  Danger to Others  Danger to Others None reported or observed

## 2023-11-06 NOTE — BH IP Treatment Plan (Signed)
 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)   Current Medications:  Current Facility-Administered Medications  Medication Dose Route Frequency Provider Last Rate Last Admin   acetaminophen  (TYLENOL ) tablet 650 mg  650 mg Oral Q6H PRN Onuoha, Chinwendu V, NP   650 mg at 11/05/23 2252   alum & mag hydroxide-simeth (MAALOX/MYLANTA) 200-200-20 MG/5ML suspension 30 mL  30 mL Oral Q4H PRN Onuoha, Chinwendu V, NP       haloperidol  (HALDOL ) tablet 5 mg  5 mg Oral TID PRN Onuoha, Chinwendu V, NP       And   diphenhydrAMINE  (BENADRYL ) capsule 50 mg  50 mg Oral TID PRN Onuoha, Chinwendu V, NP       haloperidol  lactate (HALDOL ) injection 5 mg  5 mg Intramuscular TID PRN Onuoha, Chinwendu V, NP       And   diphenhydrAMINE  (BENADRYL ) injection 50 mg  50 mg Intramuscular TID PRN Onuoha, Chinwendu V, NP       And   LORazepam  (ATIVAN ) injection 2 mg  2 mg Intramuscular TID PRN Onuoha, Chinwendu V, NP       haloperidol  lactate (HALDOL ) injection 10 mg  10 mg Intramuscular TID PRN Onuoha, Chinwendu V, NP       And   diphenhydrAMINE  (BENADRYL ) injection 50 mg  50 mg Intramuscular TID PRN Onuoha, Chinwendu V, NP       And   LORazepam  (ATIVAN ) injection 2 mg  2 mg Intramuscular TID PRN Onuoha, Chinwendu V, NP       hydrOXYzine  (ATARAX ) tablet 25 mg  25 mg Oral TID PRN Onuoha, Chinwendu V, NP   25 mg at 11/05/23 2252   magnesium  hydroxide (MILK OF MAGNESIA) suspension 30 mL  30 mL Oral Daily PRN Onuoha, Chinwendu V, NP       traZODone  (DESYREL ) tablet 50 mg  50 mg Oral QHS PRN Onuoha, Chinwendu V, NP   50 mg at 11/05/23 2252   PTA Medications: Medications Prior to Admission  Medication Sig Dispense Refill Last Dose/Taking    HYDROcodone-acetaminophen  (NORCO/VICODIN) 5-325 MG tablet Take 1 tablet by mouth every 6 (six) hours as needed for moderate pain (pain score 4-6) or severe pain (pain score 7-10).      ibuprofen (ADVIL) 200 MG tablet Take 600 mg by mouth every 6 (six) hours as needed for moderate pain (pain score 4-6), mild pain (pain score 1-3) or headache.       Patient Stressors: Financial difficulties   Other: Being Homeless    Patient Strengths: Capable of independent living  General fund of knowledge  Supportive family/friends   Treatment Modalities: Medication Management, Group therapy, Case management,  1 to 1 session with clinician, Psychoeducation, Recreational therapy.   Physician Treatment Plan for Primary Diagnosis: MDD (major depressive disorder) Long Term Goal(s): Improvement in symptoms so as ready for discharge   Short Term Goals: Ability to identify changes in lifestyle to reduce recurrence of condition will improve Ability to verbalize feelings will improve Ability to disclose and discuss suicidal ideas Ability to demonstrate self-control will improve Ability to identify and develop effective coping behaviors will improve Ability to maintain clinical measurements within normal limits will improve Compliance with prescribed medications will improve Ability to identify triggers associated with substance abuse/mental health issues  will improve  Medication Management: Evaluate patient's response, side effects, and tolerance of medication regimen.  Therapeutic Interventions: 1 to 1 sessions, Unit Group sessions and Medication administration.  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)  Long Term Goal(s): Improvement in symptoms so as ready for discharge   Short Term Goals: Ability to identify changes in lifestyle  to reduce recurrence of condition will improve Ability to verbalize feelings will improve Ability to disclose and discuss suicidal ideas Ability to demonstrate self-control will improve Ability to identify and develop effective coping behaviors will improve Ability to maintain clinical measurements within normal limits will improve Compliance with prescribed medications will improve Ability to identify triggers associated with substance abuse/mental health issues will improve     Medication Management: Evaluate patient's response, side effects, and tolerance of medication regimen.  Therapeutic Interventions: 1 to 1 sessions, Unit Group sessions and Medication administration.  Evaluation of Outcomes: Not Progressing   RN Treatment Plan for Primary Diagnosis: MDD (major depressive disorder) Long Term Goal(s): Knowledge of disease and therapeutic regimen to maintain health will improve  Short Term Goals: Ability to remain free from injury will improve, Ability to verbalize frustration and anger appropriately will improve, Ability to demonstrate self-control, Ability to participate in decision making will improve, Ability to verbalize feelings will improve, Ability to disclose and discuss suicidal ideas, Ability to identify and develop effective coping behaviors will improve, and Compliance with prescribed medications will improve  Medication Management: RN will administer medications as ordered by provider, will assess and evaluate patient's response and provide education to patient for prescribed medication. RN will report any adverse and/or side effects to prescribing provider.  Therapeutic Interventions: 1 on 1 counseling sessions, Psychoeducation, Medication administration, Evaluate responses to treatment, Monitor vital signs and CBGs as ordered, Perform/monitor CIWA, COWS, AIMS and Fall Risk screenings as ordered, Perform wound care treatments as ordered.  Evaluation of Outcomes: Not  Progressing   LCSW Treatment Plan for Primary Diagnosis: MDD (major depressive disorder) Long Term Goal(s): Safe transition to appropriate next level of care at discharge, Engage patient in therapeutic group addressing interpersonal concerns.  Short Term Goals: Engage patient in aftercare planning with referrals and resources, Increase social support, Increase ability to appropriately verbalize feelings, Increase emotional regulation, Facilitate acceptance of mental health diagnosis and concerns, Facilitate patient progression through stages of change regarding substance use diagnoses and concerns, Identify triggers associated with mental health/substance abuse issues, and Increase skills for wellness and recovery  Therapeutic Interventions: Assess for all discharge needs, 1 to 1 time with Social worker, Explore available resources and support systems, Assess for adequacy in community support network, Educate family and significant other(s) on suicide prevention, Complete Psychosocial Assessment, Interpersonal group therapy.  Evaluation of Outcomes: Not Progressing   Progress in Treatment: Attending groups:  No Participating in group:  No Taking medication as prescribed: Patient has not yet been scheduled to take his medications.   Toleration medication: N/A Family/Significant other contact made: No, will contact:  girlfriend, [name] [phone] Patient understands diagnosis: No. Discussing patient identified problems/goals with staff: No. Medical problems stabilized or resolved: Yes. Denies suicidal/homicidal ideation: Yes. Issues/concerns per patient self-inventory: No.  New problem(s) identified:  No  New Short Term/Long Term Goal(s):  Patient recently admitted. CSW will continue to follow and assess for appropriate referrals and possible discharge planning.   Patient Goals:  "  I want to leave the hospital right now.  I'm not suicidal.  I got drunk in front of my job because  in I was in pain.  I had 8 teeth removed on 4/8.  I care about my job.  I don't have any mental health issues.  I don't need a psychiatrist or a therapist."   Discharge Plan or Barriers:  Patient recently admitted. CSW will continue to follow and assess for appropriate referrals and possible discharge planning.    Reason for Continuation of Hospitalization: Depression Medication stabilization Suicidal ideation  Estimated Length of Stay:  5 - 7 days  Last 3 [place] Suicide Severity Risk Score: Flowsheet Row [facility]Current[facility] Most recent reading at 11/05/2023 10:12 PM ED from 11/05/2023 in [facility] Emergency Department at [facility] Most recent reading at 11/05/2023 12:47 AM UC from 05/02/2022 in [facility]) Most recent reading at 05/02/2022  2:24 PM  C-SSRS RISK CATEGORY Low Risk Low Risk No Risk       Last PHQ 2/9 Scores:    09/24/2018   11:17 AM 04/08/2017    3:15 PM 04/08/2017    3:03 PM  Depression screen PHQ 2/9  Decreased Interest 0 0 0  Down, Depressed, Hopeless 0 0 0  PHQ - 2 Score 0 0 0  Altered sleeping  0   Tired, decreased energy  0   Change in appetite  0   Feeling bad or failure about yourself   0   Trouble concentrating  0   Moving slowly or fidgety/restless  1   Suicidal thoughts  0   PHQ-9 Score  1   Difficult doing work/chores  Not difficult at all     Scribe for Treatment Team: Layali Freund O Jacorian Golaszewski, LCSWA 11/06/2023 6:02 PM

## 2023-11-06 NOTE — H&P (Addendum)
 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  PER evaluation in the ED by psychiatry yesterday: "On evaluation Phillip Dixon reports that he was not trying to harm himself, but states he was trying to "numb the pain" of his girlfriend breaking up with him. Patient girlfriend Phillip Dixon is here visiting patient. Patient becomes upset when this provider informs him, he is recommended for inpatient admission, based on the attempted overdose. Patient is focused on being discharged, and leaving the hospital, as he states he has a job he has to get back to. This provider discusses with him consequences of the actions we make, patient continues to say he just wanted to numb the pain, and states he takes Tylenol  all the time, but could not say why he was taking Tylenol . Patient states he is homeless, and he needs his job, he confirms he works part time at [employer]. He states his appetite and sleep are fair. Denies ever being admitted to an inpatient psychiatric facility, but states he has seen a therapist before (years ago) and was diagnosed with depression and ADHD. Patient states he does not take medication, and says when he took medication for ADHD it made him lose his appetite and he did not continue to take it.     Per Chart Review patient was seen in the ED in 07/2016 due to SI and HI thoughts. Patient has a past hx of cutting. Patient UDS positive for THC and BAL is 30 on admission. "      Collateral information obtained in ED yesterday by  psych provider:  With patient permission, spoke with his girlfriend Phillip Dixon, she states she has known patient about 8 yrs since high school, says he has always been very hyper, irritable, trouble focusing, and depressed for a very long time. She agrees that patient needs help with mental health treatment. She states he has been homeless, she lives with her parents and he sleeps in her car, and he drinks alcohol a lot, while taking over the counter pain medications, and THC. She states he was raised by his paternal aunt Phillip Dixon, his biological mother lives in [place], and his bio father lives here in [place] in a boarding house. She states that patients family does not believe in mental health help or treatment. She states the only real support patient had in his family was his paternal grandfather who passed away when patient was 48 yrs old. She states he face timed her last night, and was walking in traffic after she broke up with him, because she states they need to focus on self, and get individual help, she feels she has enabled him and he wont let her break up with him.        Patient seen for treatment team with social worker and is demanding stating "I got drunk, I've had 8 teeth removed, and I'm homeless." States he is not suicidal or homicidal. Patient continues to minimize how he ended up in the hospital but admits his ex-girlfriend and cousin called the  police after he called them  intoxicated on the phone and they were concerned about him.  Patient states his ex-girlfriend continues to be his biggest support and that he is still convinced they will get back together. Patient gave me permission to speak with her: Phillip Dixon [phone]). I spoke with patient's girlfriend who confirms patient called her and sounded drunk and was making vague statements about "my pain going away" and they were concerned and called the police. Reports patient has chronic difficulty regulating mood and  dealing with stressor in a healthy way with recent stressor being homelessness, and her breaking up with him. Patient refuses to accept that she is breaking up with him. States patient has made passive statements of "going away" before but has not said he was thinking about killing himself or stated he had any plan to do so. Denies any knowledge of prior suicide attempts. She believes he needs to see a therapist to develop healthier coping skills.   Patient denies drinking most days, denies history of withdrawal. DENies history of DUIs or rehab. States he drank several shots of Tequila that day in the parking lot outside work and was drunk. States he does not remember much after that but denies being suicidal. Does admit to multiple stressors including homelessness, sleeping in cousins car and working at [employer] as well as girlfriend breaking up with him. Denies SI, HI or AVH. Denies feeling depressed or anxious. Admits to cannabis use however "for stress." Denies history of manic episodes or psychosis.   Psych Review of Symptoms:  ADHD: Patient denied any symptoms.     Anxiety: Patient denied any symptoms.     Developmental and Sensory Concerns: Patient denied any symptoms.     Depressive Symptoms: Patient denied any symptoms.     Disruptive and Conduct Symptoms: Patient denied any symptoms.     Obsessive-Compulsive Symptoms: Patient denied any symptoms.     Psychotic Symptoms: Patient denied any symptoms.      Trauma Related Symptoms: Patient denied any symptoms.      Sleep Concerns: Patient denied any symptoms.       Substance History Alcohol: Yes  Type of alcohol: liquor Last Drink; day prior to yesterday and states that he was intoxicated Number of drinks per day: patient denies drinking daily, and denies any history of withdrawal symptoms History of alcohol withdrawal seizures Denies  History of DT's: Denies  Tobacco: Yes, 1ppd Illicit drugs:  THC Prescription drug abuse: Denies  Rehab hx: Denies    Total Time spent with patient: 30 minutes  Past Psychiatric History:  Prev Dx/Sx: depression and anxiety per ED note however patient denies any psychiatric history Current Psych Provider: None Home Meds (current): None Previous Med Trials: None Therapy: years ago   Prior Psych Hospitalization: Denies   Prior Self Harm: Yes Prior Violence: Yes   Family Psych History: Yes Family Hx suicide: Denies        Is the patient at risk to self? Yes.    Has the patient been a risk to self in the past 6 months? Yes.    Has the patient been a risk to self within the distant past? No.  Is the patient a risk to others? No.  Has the patient been a risk to others in the past 6 months? No.  Has the patient been a risk to others within the distant past? No.   [place] Scale:  Flowsheet Row [facility] (Current[facility] Most recent reading at 11/05/2023  10:12 PM ED from 11/05/2023 in [facility] at [facility] Most recent reading at 11/05/2023 12:47 AM UC from 05/02/2022 in [facility] at [facility]) Most recent reading at 05/02/2022  2:24 PM  C-SSRS RISK CATEGORY Low Risk Low Risk No Risk        Prior Inpatient Therapy: No.  Prior Outpatient Therapy: No.   Alcohol Screening: 1. How often do you have a drink containing alcohol?: 2 to 4 times a month 2. How many drinks containing alcohol do you have on a typical day when you are drinking?: 3 or 4 3. How often do you have six or more drinks on one occasion?: Never AUDIT-C Score: 3 4. How often during the last year have you found that you were not able to stop drinking once you had started?: Never 5. How often during the last year have you failed to do what was normally expected from you because of drinking?: Never 6. How often during the last year have you needed a first drink  in the morning to get yourself going after a heavy drinking session?: Never 7. How often during the last year have you had a feeling of guilt of remorse after drinking?: Never 8. How often during the last year have you been unable to remember what happened the night before because you had been drinking?: Never 9. Have you or someone else been injured as a result of your drinking?: No 10. Has a relative or friend or a doctor or another health worker been concerned about your drinking or suggested you cut down?: No Alcohol Use Disorder Identification Test Final Score (AUDIT): 3 Alcohol Brief Interventions/Follow-up: Alcohol education/Brief advice Substance Abuse History in the last 12 months:  Yes.   Consequences of Substance Abuse: NA Previous Psychotropic Medications: No  Psychological Evaluations: No  Past Medical History:  Past Medical History:  Diagnosis Date   ADHD (attention deficit hyperactivity disorder)    Asthma    Oppositional defiant disorder     Past Surgical History:  Procedure Laterality Date   tonsillectomy     Family History: History reviewed. No pertinent family history. Family Psychiatric  History: reports mother with possible depression but denies history of suicide attempts Tobacco Screening:  Social History   Tobacco Use  Smoking Status Some Days   Types: Cigarettes, Cigars  Smokeless Tobacco Never    BH Tobacco Counseling     Are you interested in Tobacco Cessation Medications?  No value filed. Counseled patient on smoking cessation:  No value filed. Reason Tobacco Screening Not Completed: No value filed.       Social History:  Social History   Substance and Sexual Activity  Alcohol Use Yes     Social History   Substance and Sexual Activity  Drug Use Yes   Types: Marijuana    Additional Social History: Marital status: Single Are you sexually active?: Yes Has your sexual activity been affected by drugs, alcohol, medication, or emotional  stress?: "No" Does patient have children?: No                         Allergies:  No Known Allergies Lab Results:  Results for orders placed or performed during the hospital encounter of 11/05/23 (from the past 48 hours)  Comprehensive metabolic panel     Status: Abnormal   Collection Time: 11/05/23  1:24 AM  Result Value Ref Range   Sodium 135 135 -  145 mmol/L   Potassium 3.4 (L) 3.5 - 5.1 mmol/L   Chloride 103 98 - 111 mmol/L   CO2 22 22 - 32 mmol/L   Glucose, Bld 95 70 - 99 mg/dL    Comment: Glucose reference range applies only to samples taken after fasting for at least 8 hours.   BUN 12 6 - 20 mg/dL   Creatinine, Ser 3.08 0.61 - 1.24 mg/dL   Calcium 8.7 (L) 8.9 - 10.3 mg/dL   Total Protein 7.4 6.5 - 8.1 g/dL   Albumin 4.2 3.5 - 5.0 g/dL   AST 21 15 - 41 U/L   ALT 14 0 - 44 U/L   Alkaline Phosphatase 68 38 - 126 U/L   Total Bilirubin 0.8 0.0 - 1.2 mg/dL   GFR, Estimated >65 >78 mL/min    Comment: (NOTE) Calculated using the CKD-EPI Creatinine Equation (2021)    Anion gap 10 5 - 15    Comment: Performed at [facility], [address], [place], [place] [address]  CBC with Differential     Status: None   Collection Time: 11/05/23  1:24 AM  Result Value Ref Range   WBC 8.4 4.0 - 10.5 K/uL   RBC 4.89 4.22 - 5.81 MIL/uL   Hemoglobin 14.6 13.0 - 17.0 g/dL   HCT 95.2 84.1 - 32.4 %   MCV 91.6 80.0 - 100.0 fL   MCH 29.9 26.0 - 34.0 pg   MCHC 32.6 30.0 - 36.0 g/dL   RDW 40.1 02.7 - 25.3 %   Platelets 284 150 - 400 K/uL   nRBC 0.0 0.0 - 0.2 %   Neutrophils Relative % 60 %   Neutro Abs 5.0 1.7 - 7.7 K/uL   Lymphocytes Relative 31 %   Lymphs Abs 2.6 0.7 - 4.0 K/uL   Monocytes Relative 8 %   Monocytes Absolute 0.7 0.1 - 1.0 K/uL   Eosinophils Relative 1 %   Eosinophils Absolute 0.1 0.0 - 0.5 K/uL   Basophils Relative 0 %   Basophils Absolute 0.0 0.0 - 0.1 K/uL   Immature Granulocytes 0 %   Abs Immature Granulocytes 0.02 0.00 - 0.07 K/uL     Comment: Performed at [facility], [address], [place], [place] [address]  Ethanol     Status: Abnormal   Collection Time: 11/05/23  1:25 AM  Result Value Ref Range   Alcohol, Ethyl (B) 30 (H) <15 mg/dL    Comment: Please note change in reference range. (NOTE) For medical purposes only. Performed at [facility], [address], [place], [place] [address]   Acetaminophen  level     Status: Abnormal   Collection Time: 11/05/23  1:25 AM  Result Value Ref Range   Acetaminophen  (Tylenol ), Serum <10 (L) 10 - 30 ug/mL    Comment: (NOTE) Therapeutic concentrations vary significantly. A range of 10-30 ug/mL  may be an effective concentration for many patients. However, some  are best treated at concentrations outside of this range. Acetaminophen  concentrations >150 ug/mL at 4 hours after ingestion  and >50 ug/mL at 12 hours after ingestion are often associated with  toxic reactions.  Performed at [facility], [address] [place], [place] [address]   Salicylate level     Status: Abnormal   Collection Time: 11/05/23  1:25 AM  Result Value Ref Range   Salicylate Lvl <7.0 (L) 7.0 - 30.0 mg/dL    Comment: Performed at [facility], [address] [place], [place] [address]  Urinalysis, Routine w reflex microscopic -Urine, Clean Catch     Status: None   Collection Time: 11/05/23  1:42 AM  Result Value Ref Range   Color, Urine YELLOW YELLOW   APPearance CLEAR CLEAR   Specific Gravity, Urine 1.013 1.005 - 1.030   pH 7.0 5.0 - 8.0   Glucose, UA NEGATIVE NEGATIVE mg/dL   Hgb urine dipstick NEGATIVE NEGATIVE   Bilirubin Urine NEGATIVE NEGATIVE   Ketones, ur NEGATIVE NEGATIVE mg/dL   Protein, ur NEGATIVE NEGATIVE mg/dL   Nitrite NEGATIVE NEGATIVE   Leukocytes,Ua NEGATIVE NEGATIVE    Comment: Performed at [facility], [address], [place], [place] [address]  Urine rapid drug screen  (hosp performed)     Status: Abnormal   Collection Time: 11/05/23  1:43 AM  Result Value Ref Range   Opiates NONE DETECTED NONE DETECTED   Cocaine NONE DETECTED NONE DETECTED   Benzodiazepines NONE DETECTED NONE DETECTED   Amphetamines NONE DETECTED NONE DETECTED   Tetrahydrocannabinol POSITIVE (A) NONE DETECTED   Barbiturates NONE DETECTED NONE DETECTED    Comment: (NOTE) DRUG SCREEN FOR MEDICAL PURPOSES ONLY.  IF CONFIRMATION IS NEEDED FOR ANY PURPOSE, NOTIFY LAB WITHIN 5 DAYS.  LOWEST DETECTABLE LIMITS FOR URINE DRUG SCREEN Drug Class                     Cutoff (ng/mL) Amphetamine  and metabolites    1000 Barbiturate and metabolites    200 Benzodiazepine                 200 Opiates and metabolites        300 Cocaine and metabolites        300 THC                            50 Performed at [facility], [address] [place], [place] [address]     Blood Alcohol level:  Lab Results  Component Value Date   ETH 30 (H) 11/05/2023   ETH <5 08/08/2016    Metabolic Disorder Labs:  No results found for: "HGBA1C", "MPG" No results found for: "PROLACTIN" No results found for: "CHOL", "TRIG", "HDL", "CHOLHDL", "VLDL", "LDLCALC"  Current Medications: Current Facility-Administered Medications  Medication Dose Route Frequency Provider Last Rate Last Admin   acetaminophen  (TYLENOL ) tablet 650 mg  650 mg Oral Q6H PRN Onuoha, Chinwendu V, NP   650 mg at 11/05/23 2252   alum & mag hydroxide-simeth (MAALOX/MYLANTA) 200-200-20 MG/5ML suspension 30 mL  30 mL Oral Q4H PRN Onuoha, Chinwendu V, NP       haloperidol  (HALDOL ) tablet 5 mg  5 mg Oral TID PRN Onuoha, Chinwendu V, NP       And   diphenhydrAMINE  (BENADRYL ) capsule 50 mg  50 mg Oral TID PRN Onuoha, Chinwendu V, NP       haloperidol  lactate (HALDOL ) injection 5 mg  5 mg Intramuscular TID PRN Onuoha, Chinwendu V, NP       And   diphenhydrAMINE  (BENADRYL ) injection 50 mg  50 mg Intramuscular TID PRN Onuoha,  Chinwendu V, NP       And   LORazepam  (ATIVAN ) injection 2 mg  2 mg Intramuscular TID PRN Onuoha, Chinwendu V, NP       haloperidol  lactate (HALDOL ) injection 10 mg  10 mg Intramuscular TID PRN Onuoha, Chinwendu V, NP  And   diphenhydrAMINE  (BENADRYL ) injection 50 mg  50 mg Intramuscular TID PRN Onuoha, Chinwendu V, NP       And   LORazepam  (ATIVAN ) injection 2 mg  2 mg Intramuscular TID PRN Onuoha, Chinwendu V, NP       hydrOXYzine  (ATARAX ) tablet 25 mg  25 mg Oral TID PRN Onuoha, Chinwendu V, NP   25 mg at 11/05/23 2252   magnesium  hydroxide (MILK OF MAGNESIA) suspension 30 mL  30 mL Oral Daily PRN Onuoha, Chinwendu V, NP       traZODone  (DESYREL ) tablet 50 mg  50 mg Oral QHS PRN Onuoha, Chinwendu V, NP   50 mg at 11/05/23 2252   PTA Medications: Medications Prior to Admission  Medication Sig Dispense Refill Last Dose/Taking   HYDROcodone-acetaminophen  (NORCO/VICODIN) 5-325 MG tablet Take 1 tablet by mouth every 6 (six) hours as needed for moderate pain (pain score 4-6) or severe pain (pain score 7-10).      ibuprofen (ADVIL) 200 MG tablet Take 600 mg by mouth every 6 (six) hours as needed for moderate pain (pain score 4-6), mild pain (pain score 1-3) or headache.       Musculoskeletal: Strength & Muscle Tone: within normal limits Gait & Station: normal Patient leans: N/A            Psychiatric Specialty Exam:  Presentation  General Appearance:  Appropriate for Environment  Eye Contact: Good  Speech: Clear and Coherent  Speech Volume: Normal  Handedness:No data recorded  Mood and Affect  Mood: Anxious; Irritable  Affect: Congruent   Thought Process  Thought Processes: Coherent  Duration of Psychotic Symptoms:N/A Past Diagnosis of Schizophrenia or Psychoactive disorder: No data recorded Descriptions of Associations:Intact  Orientation:Full (Time, Place and Person)  Thought Content:WDL  Hallucinations:Hallucinations: None  Ideas of  Reference:None  Suicidal Thoughts:Suicidal Thoughts: No  Homicidal Thoughts:Homicidal Thoughts: No   Sensorium  Memory: Immediate Fair  Judgment: Fair  Insight: Fair   Art therapist  Concentration: Fair  Attention Span: Fair  Recall: [employer] of Knowledge: Fair  Language: Fair   Psychomotor Activity  Psychomotor Activity: Psychomotor Activity: Normal   Assets  Assets: Communication Skills; Desire for Improvement; Financial Resources/Insurance; Physical Health   Sleep  Sleep: Sleep: Fair    Physical Exam: Physical exam: Please see exam on admit note. General: Well developed, well nourished.  Pupils: Normal at 3mm Respiratory: Breathing is unlabored.  Cardiovascular: No edema.  Language: No anomia, no aphasia Muscle strength and tone-pt moving all extremities.  Gait not assessed as pt remained in bed.  Neuro: Facial muscles are symmetric. Pt without tremor, no evidence of hyperarousal.  Review of Systems  Constitutional: Negative.   HENT: Negative.    Eyes: Negative.   Respiratory: Negative.    Cardiovascular: Negative.   Gastrointestinal: Negative.   Genitourinary: Negative.   Musculoskeletal: Negative.   Skin: Negative.   Neurological: Negative.   Endo/Heme/Allergies: Negative.   Psychiatric/Behavioral: Negative.     Blood pressure (!) 136/93, pulse 69, temperature 97.9 F (36.6 C), temperature source Oral, resp. rate 16, height 6\' 1"  (1.854 m), weight 66.7 kg, SpO2 98%. Body mass index is 19.39 kg/m.  Treatment Plan Summary: Patient denies all symptoms of depression, anxiety or PTSD. Admits to cutting in the past but denies recently. Denies he tried to commit suicide. Patient irritable and demanding, refuses any medications. Patient did give me permission to speak with his girlfriend.   Girlfriend notes  long pattern of poor mood regulation  and coping skills with episodes of anger. States he needs to see a therapist and work  on Pharmacologist. States patient made a vague statement of "making the pain go away" but did not say he tried to kill himself. She states patient's symptoms are intermittent as opposed to reporting depressed mood every day.   Patient has multiple risk factors for suicide including uncontrolled pain, alcohol and cannabis abuse, taking 8 pills of Ibuprofen while intoxicated, homelessness, relationship with girlfriend ending, limited social support and poor and maladaptive coping skills. Patient also has a history of cutting. Appears irritable and to be minimizing. He will benefit from therapy inpatient and outpatient. I discussed trial of Lexapro however patient is declining.   In the evening patient tells me he does have anxiety at times and will self medicate with cannabis which can sometimes make him paranoid. Discussed r/b/a of trial of mirtazapine  and patient is agreeable.   Daily contact with patient to assess and evaluate symptoms and progress in treatment, Medication management, and Plan patient declines antidepressant or Naltrexone, group therapy and outpatient psychotherapy referral  Observation Level/Precautions:  15 minute checks  Laboratory:  CBC Chemistry Profile Folic Acid UDS UA Vitamin B-12  Psychotherapy:    Medications:    Consultations:    Discharge Concerns:  1-3 days  Estimated LOS:  1-3 days  Other:     Physician Treatment Plan for Primary Diagnosis: MDD (major depressive disorder) Long Term Goal(s): Improvement in symptoms so as ready for discharge  Short Term Goals: Ability to identify changes in lifestyle to reduce recurrence of condition will improve, Ability to verbalize feelings will improve, Ability to disclose and discuss suicidal ideas, Ability to demonstrate self-control will improve, Ability to identify and develop effective coping behaviors will improve, Ability to maintain clinical measurements within normal limits will improve, Compliance with prescribed  medications will improve, and Ability to identify triggers associated with substance abuse/mental health issues will improve   I certify that inpatient services furnished can reasonably be expected to improve the patient's condition.    Edwen Mclester, MD 4/25/20253:14 PM

## 2023-11-06 NOTE — Group Note (Signed)
 Recreation Therapy Group Note   Group Topic:Communication  Group Date: 11/06/2023 Start Time: 1610 End Time: 1004 Facilitators: Lorilee Cafarella-McCall, LRT,CTRS Location: [address] Day[address]   Group Topic: Communication, Problem Solving   Goal Area(s) Addresses:  Patient will effectively listen to complete activity.  Patient will identify communication skills used to make activity successful.  Patient will identify how skills used during activity can be used to reach post d/c goals.    Intervention: Building surveyor Activity - Geometric pattern cards, pencils, blank paper    Activity: Geometric Drawings.  Three volunteers from the peer group will be shown an abstract picture with a particular arrangement of geometrical shapes.  Each round, one 'speaker' will describe the pattern, as accurately as possible without revealing the image to the group.  The remaining group members will listen and draw the picture to reflect how it is described to them. Patients with the role of 'listener' cannot ask clarifying questions but, may request that the speaker repeat a direction. Once the drawings are complete, the presenter will show the rest of the group the picture and compare how close each person came to drawing the picture. LRT will facilitate a post-activity discussion regarding effective communication and the importance of planning, listening, and asking for clarification in daily interactions with others.  Education: Environmental consultant, Active listening, Support systems, Discharge planning  Education Outcome: Acknowledges understanding/In group clarification offered/Needs additional education.    Affect/Mood: N/A   Participation Level: Did not attend    Clinical Observations/Individualized Feedback:     Plan: Continue to engage patient in RT group sessions 2-3x/week.   Idania Desouza-McCall, LRT,CTRS 11/06/2023 12:58 PM

## 2023-11-06 NOTE — Group Note (Signed)
 Date:  11/06/2023 Time:  1:37 PM  Group Topic/Focus:  Goals Group:   The focus of this group is to help patients establish daily goals to achieve during treatment and discuss how the patient can incorporate goal setting into their daily lives to aide in recovery. Orientation:   The focus of this group is to educate the patient on the purpose and policies of crisis stabilization and provide a format to answer questions about their admission.  The group details unit policies and expectations of patients while admitted.    Participation Level:  Did Not Attend   Sheryl Donna 11/06/2023, 1:37 PM

## 2023-11-07 DIAGNOSIS — F329 Major depressive disorder, single episode, unspecified: Secondary | ICD-10-CM | POA: Diagnosis not present

## 2023-11-07 DIAGNOSIS — F10121 Alcohol abuse with intoxication delirium: Secondary | ICD-10-CM | POA: Diagnosis not present

## 2023-11-07 MED ORDER — GUANFACINE HCL ER 1 MG PO TB24
1.0000 mg | ORAL_TABLET | Freq: Every day | ORAL | Status: DC
  Administered 2023-11-07 – 2023-11-08 (×2): 1 mg via ORAL
  Filled 2023-11-07 (×5): qty 1

## 2023-11-07 NOTE — Group Note (Signed)
 LCSW Group Therapy Note   Group Date: 11/07/2023 Start Time: 1030 End Time: 1145   Type of Therapy and Topic:  Group Therapy:   Participation Level:  Minimal  Description of Group: Group today was about growth mind set how we can change the way we think about ourselves. We also spoke about how self esteem plays a part with our mind set.    Therapeutic Goals:   1. Changing mind set to to help with daily decisions as well coping skills.      Summary of Patient Progress:     Patient came late however did speak a little in group/    Therapeutic Modalities:   MI / CBT    Phillip Dixon, LCSWA 11/07/2023  1:28 PM

## 2023-11-07 NOTE — Plan of Care (Signed)
  Problem: Education: Goal: Mental status will improve Outcome: Progressing Goal: Verbalization of understanding the information provided will improve Outcome: Progressing   Problem: Activity: Goal: Interest or engagement in activities will improve Outcome: Progressing   Problem: Coping: Goal: Ability to verbalize frustrations and anger appropriately will improve Outcome: Progressing   Problem: Safety: Goal: Periods of time without injury will increase Outcome: Progressing

## 2023-11-07 NOTE — Plan of Care (Signed)
  Problem: Education: Goal: Emotional status will improve Outcome: Progressing Goal: Mental status will improve Outcome: Progressing Goal: Verbalization of understanding the information provided will improve Outcome: Progressing   Problem: Activity: Goal: Interest or engagement in activities will improve Outcome: Progressing   Problem: Safety: Goal: Periods of time without injury will increase Outcome: Progressing

## 2023-11-07 NOTE — Progress Notes (Signed)
   11/07/23 1016  Psych Admission Type (Psych Patients Only)  Admission Status Involuntary  Psychosocial Assessment  Patient Complaints Irritability  Eye Contact Fair  Facial Expression Anxious  Affect Irritable  Speech Logical/coherent  Interaction Assertive  Motor Activity Restless  Appearance/Hygiene Unremarkable  Behavior Characteristics Cooperative  Mood Anxious;Irritable  Thought Process  Coherency Circumstantial  Content Blaming others  Delusions None reported or observed  Perception WDL  Hallucination None reported or observed  Judgment Poor  Confusion None  Danger to Self  Current suicidal ideation? Denies  Agreement Not to Harm Self Yes  Description of Agreement Verbal  Danger to Others  Danger to Others None reported or observed

## 2023-11-07 NOTE — Progress Notes (Addendum)
 [facility] MD Progress Note  11/07/2023 4:00 PM Phillip Dixon  MRN:  [id number] Subjective:  Patient reports tolerating starting mirtazapine  well and reports improvement in sleep. Patient appears calmer and less irritable on evaluation today and tells me his goals are to get his drivers license and to stop drinking alcohol. Denies feeling depressed or anhedonia and patient states he enjoyed going to some of the groups yesterday. Patient admits to feeling anxious most days and worrying about most things, difficulty falling asleep as well as difficulty concentrating. States he also gets muscle tension. Patient states he smokes cannabis usually but admits it sometimes makes him paranoid thus was willing to try mirtazapine .  States he does not have any cravings and declines naltrexone. Patient also future oriented on returning to work. Denies SI or wanting to be dead. Denies feeling hopeless. Patient is attending groups. Plan for discharge tomorrow with outpatient psychiatric referrals.  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) Generalized anxiety disorder Cannabis use disorder  Total Time spent with patient: 30 minutes  Past Psychiatric History:  Prev Dx/Sx: depression and anxiety per ED note however patient denies any psychiatric history Current Psych Provider: None Home Meds (current): None Previous Med Trials: None Therapy: years ago   Prior Psych Hospitalization: Denies   Prior Self Harm: Yes Prior Violence: Yes  Past Medical History:  Past Medical History:  Diagnosis Date   ADHD (attention deficit hyperactivity disorder)    Asthma    Oppositional defiant disorder     Past Surgical History:  Procedure Laterality Date   tonsillectomy     Family History: History reviewed. No pertinent family history. Family Psychiatric  History:  reports  mother with possible depression but denies history of suicide attempts   Social History:  Social History   Substance and Sexual Activity  Alcohol Use Yes     Social History   Substance and Sexual Activity  Drug Use Yes   Types: Marijuana    Social History   Socioeconomic History   Marital status: Single    Spouse name: Not on file   Number of children: Not on file   Years of education: Not on file   Highest education level: Not on file  Occupational History   Not on file  Tobacco Use   Smoking status: Some Days    Types: Cigarettes, Cigars   Smokeless tobacco: Never  Vaping Use   Vaping status: Every Day  Substance and Sexual Activity   Alcohol use: Yes   Drug use: Yes    Types: Marijuana   Sexual activity: Never  Other Topics Concern   Not on file  Social History Narrative   Not on file   Social Drivers of Health   Financial Resource Strain: Not on file  Food Insecurity: Food Insecurity Present (11/05/2023)   Hunger Vital Sign    Worried About Running Out of Food in the Last Year: Sometimes true    Ran Out of Food in the Last Year: Sometimes true  Transportation Needs: No Transportation Needs (11/05/2023)   PRAPARE - Administrator, Civil Service (Medical): No    Lack of Transportation (Non-Medical): No  Physical Activity: Not on file  Stress: Not on file  Social Connections: Not on file   Additional Social History:  Sleep: Fair  Appetite:  Fair  Current Medications: Current Facility-Administered Medications  Medication Dose Route Frequency Provider Last Rate Last Admin   acetaminophen  (TYLENOL ) tablet 650 mg  650 mg Oral Q6H PRN Onuoha, Chinwendu V, NP   650 mg at 11/05/23 2252   alum & mag hydroxide-simeth (MAALOX/MYLANTA) 200-200-20 MG/5ML suspension 30 mL  30 mL Oral Q4H PRN Onuoha, Chinwendu V, NP       haloperidol  (HALDOL ) tablet 5 mg  5 mg Oral TID PRN Onuoha, Chinwendu V, NP       And    diphenhydrAMINE  (BENADRYL ) capsule 50 mg  50 mg Oral TID PRN Onuoha, Chinwendu V, NP       haloperidol  lactate (HALDOL ) injection 5 mg  5 mg Intramuscular TID PRN Onuoha, Chinwendu V, NP       And   diphenhydrAMINE  (BENADRYL ) injection 50 mg  50 mg Intramuscular TID PRN Onuoha, Chinwendu V, NP       And   LORazepam  (ATIVAN ) injection 2 mg  2 mg Intramuscular TID PRN Onuoha, Chinwendu V, NP       haloperidol  lactate (HALDOL ) injection 10 mg  10 mg Intramuscular TID PRN Onuoha, Chinwendu V, NP       And   diphenhydrAMINE  (BENADRYL ) injection 50 mg  50 mg Intramuscular TID PRN Onuoha, Chinwendu V, NP       And   LORazepam  (ATIVAN ) injection 2 mg  2 mg Intramuscular TID PRN Onuoha, Chinwendu V, NP       hydrOXYzine  (ATARAX ) tablet 25 mg  25 mg Oral TID PRN Onuoha, Chinwendu V, NP   25 mg at 11/05/23 2252   magnesium  hydroxide (MILK OF MAGNESIA) suspension 30 mL  30 mL Oral Daily PRN Onuoha, Chinwendu V, NP       mirtazapine  (REMERON  SOL-TAB) disintegrating tablet 15 mg  15 mg Oral QHS Leotha Voeltz, MD   15 mg at 11/06/23 2148   traZODone  (DESYREL ) tablet 50 mg  50 mg Oral QHS PRN Onuoha, Chinwendu V, NP   50 mg at 11/05/23 2252    Lab Results: No results found for this or any previous visit (from the past 48 hours).  Blood Alcohol level:  Lab Results  Component Value Date   ETH 30 (H) 11/05/2023   ETH <5 08/08/2016    Metabolic Disorder Labs: No results found for: "HGBA1C", "MPG" No results found for: "PROLACTIN" No results found for: "CHOL", "TRIG", "HDL", "CHOLHDL", "VLDL", "LDLCALC"  Physical Findings: AIMS:  , ,  ,  ,    CIWA:    COWS:     Musculoskeletal: Strength & Muscle Tone: within normal limits Gait & Station: normal Patient leans: N/A  Psychiatric Specialty Exam:  Presentation  General Appearance:  Appropriate for Environment  Eye Contact: Fair  Speech: Clear and Coherent  Speech Volume: Normal  Handedness:No data recorded  Mood and Affect   Mood: Euthymic  Affect: Appropriate   Thought Process  Thought Processes: Coherent  Descriptions of Associations:Intact  Orientation:Full (Time, Place and Person)  Thought Content:WDL  History of Schizophrenia/Schizoaffective disorder:No data recorded Duration of Psychotic Symptoms:No data recorded Hallucinations:Hallucinations: None  Ideas of Reference:None  Suicidal Thoughts:Suicidal Thoughts: No  Homicidal Thoughts:Homicidal Thoughts: No   Sensorium  Memory: Immediate Fair  Judgment: Fair  Insight: Fair   Art therapist  Concentration: Fair  Attention Span: Fair  Recall: [employer] of Knowledge: Fair  Language: Fair   Psychomotor Activity  Psychomotor Activity: Psychomotor Activity: Normal   Assets  Assets: Manufacturing systems engineer; Desire for Improvement; Physical Health   Sleep  Sleep: Sleep: Fair    Physical Exam: Physical exam: Please see exam on admit note. General: Well developed, well nourished.  Pupils: Normal at 3mm Respiratory: Breathing is unlabored.  Cardiovascular: No edema.  Language: No anomia, no aphasia Muscle strength and tone-pt moving all extremities.  Gait not assessed as pt remained in bed.  Neuro: Facial muscles are symmetric. Pt without tremor, no evidence of hyperarousal.  Review of Systems  Constitutional: Negative.   HENT: Negative.    Eyes: Negative.   Respiratory: Negative.    Cardiovascular: Negative.   Gastrointestinal: Negative.   Genitourinary: Negative.   Musculoskeletal: Negative.   Skin: Negative.   Neurological: Negative.   Endo/Heme/Allergies: Negative.   Psychiatric/Behavioral: Negative.     Blood pressure (!) 142/83, pulse 71, temperature 98.1 F (36.7 C), temperature source Oral, resp. rate 17, height 6\' 1"  (1.854 m), weight 66.7 kg, SpO2 97%. Body mass index is 19.39 kg/m.   Treatment Plan Summary: Patient euthymic and future oriented today. Admits current presentation  is due to alcohol intoxication. Taking mirtazapine  15 mg at bedtime for GAD symptoms in place of cannabis he had been smoking which he admits makes him paranoid. Patient denies SI or wanting to be dead. Plan for discharge tomorrow.   Daily contact with patient to assess and evaluate symptoms and progress in treatment, Medication management, and Plan continue mirtazapine  15 mg at bedtime, plan for discharge tomorrow  Iyana Topor, MD 11/07/2023, 4:00 PM

## 2023-11-07 NOTE — Group Note (Unsigned)
 Date:  11/08/2023 Time:  5:08 AM  Group Topic/Focus:  Wrap-Up Group:   The focus of this group is to help patients review their daily goal of treatment and discuss progress on daily workbooks.    Participation Level:  Active  Participation Quality:  Appropriate and Sharing  Affect:  Appropriate  Cognitive:  Appropriate  Insight: Appropriate  Engagement in Group:  Engaged  Modes of Intervention:  Activity and Socialization  Additional Comments:  Patients used wrap up group sheets. Patient rated his day a "8 out of 10". Patient shared his goal for today, "stay positive" and patient did achieve his goal. Patient shared coping skills that he finds most helpful, "quiet". Patient shared something that he likes about himself, "my hair".   Dillard Frame 11/08/2023, 5:08 AM

## 2023-11-07 NOTE — [facility] Suicide Risk Assessment (Signed)
 [facility] INPATIENT:  Family/Significant Other Suicide Prevention Education  Suicide Prevention Education:  Education Completed; [name],  [phone] ) has been identified by the patient as the family member/significant other with whom the patient will be residing, and identified as the person(s) who will aid the patient in the event of a mental health crisis (suicidal ideations/suicide attempt).  With written consent from the patient, the family member/significant other has been provided the following suicide prevention education, prior to the and/or following the discharge of the patient.  The suicide prevention education provided includes the following: Suicide risk factors Suicide prevention and interventions National Suicide Hotline telephone number [facility] assessment telephone number [facility] Emergency Assistance 911 [facility] and/or Residential Mobile Crisis Unit telephone number  Request made of family/significant other to: Remove weapons (e.g., guns, rifles, knives), all items previously/currently identified as safety concern.   Remove drugs/medications (over-the-counter, prescriptions, illicit drugs), all items previously/currently identified as a safety concern.  The family member/significant other verbalizes understanding of the suicide prevention education information provided.  The family member/significant other agrees to remove the items of safety concern listed above.  Yeni Jiggetts O Fionna Merriott 11/07/2023, 5:03 PM

## 2023-11-08 DIAGNOSIS — F10121 Alcohol abuse with intoxication delirium: Secondary | ICD-10-CM

## 2023-11-08 DIAGNOSIS — F411 Generalized anxiety disorder: Secondary | ICD-10-CM | POA: Diagnosis not present

## 2023-11-08 DIAGNOSIS — F329 Major depressive disorder, single episode, unspecified: Secondary | ICD-10-CM

## 2023-11-08 MED ORDER — MIRTAZAPINE 15 MG PO TBDP: 15.0000 mg | ORAL_TABLET | Freq: Every day | ORAL | 0 refills | Status: AC

## 2023-11-08 MED ORDER — TRAZODONE HCL 50 MG PO TABS: 50.0000 mg | ORAL_TABLET | Freq: Every evening | ORAL | 0 refills | Status: AC | PRN

## 2023-11-08 MED ORDER — GUANFACINE HCL ER 1 MG PO TB24: 1.0000 mg | ORAL_TABLET | Freq: Every day | ORAL | 0 refills | Status: AC

## 2023-11-08 NOTE — Progress Notes (Signed)
Pt discharged to lobby. Pt was stable and appreciative at that time. All papers and electronic prescriptions were given and valuables returned. Suicide safety plan completed. Verbal understanding expressed. Denies SI/HI and A/VH. Pt given opportunity to express concerns and ask questions. 

## 2023-11-08 NOTE — Group Note (Signed)
 Date:  11/08/2023 Time:  8:50 AM  Group Topic/Focus:  Goals Group:   The focus of this group is to help patients establish daily goals to achieve during treatment and discuss how the patient can incorporate goal setting into their daily lives to aide in recovery.    Participation Level:  Did Not Attend  Phillip Dixon 11/08/2023, 8:50 AM

## 2023-11-08 NOTE — Progress Notes (Signed)
  [facility] Adult Case Management Discharge Plan :  Will you be returning to the same living situation after discharge:  Yes,  patient reports going with girlfriend  At discharge, do you have transportation home?: Yes,  patient state' that the girlfriend will be picking him up.  Do you have the ability to pay for your medications: Yes,  patient has medicaid   Release of information consent forms completed and in the chart;  Patient's signature needed at discharge.  Patient to Follow up at:  Follow-up Information     [facility] Follow up on 11/18/2023.   Specialty: Behavioral Health Why: You have an appointment for medication management services on 11/18/23 at 11:00 am, Virtual.  You have an appointment for therapy services on 12/24/23 at 8:00 am with Dallie Duel, Virtual, (they will text you the link for this appointment). Contact information: [address] [place]  [phone]                Next level of care provider has access to [facility] Link:no  Safety Planning and Suicide Prevention discussed: Yes,  previous CSW      Has patient been referred to the Quitline?: Patient refused referral for treatment  Patient has been referred for addiction treatment: No known substance use disorder.  Phillip Floras Jena Tegeler, LCSW 11/08/2023, 10:10 AM

## 2023-11-08 NOTE — Progress Notes (Signed)
   11/07/23 2100  Psych Admission Type (Psych Patients Only)  Admission Status Involuntary  Psychosocial Assessment  Patient Complaints Anxiety  Eye Contact Fair  Facial Expression Anxious  Affect Anxious  Speech Logical/coherent  Interaction Assertive  Motor Activity Restless  Appearance/Hygiene Unremarkable  Behavior Characteristics Cooperative  Mood Pleasant  Thought Process  Coherency Circumstantial  Content Blaming others  Delusions None reported or observed  Perception WDL  Hallucination None reported or observed  Judgment Limited  Confusion None  Danger to Self  Current suicidal ideation? Denies  Agreement Not to Harm Self Yes  Description of Agreement Verbal Contract  Danger to Others  Danger to Others None reported or observed

## 2023-11-08 NOTE — Discharge Instructions (Signed)
 Drivers information  https://www.google.com/url?sa=t&rct=j&q=&esrc=s&source=web&cd=&ved=[email or web address]&url=https%3A%8F%8Fwww.ncdot.gov%8Fdmv%8Flicense-id%8Fdriver-licenses%8Fnew-drivers%8FDocuments%8Fdriver-handbook.pdf&usg=[email or web address]&opi=89978449  Please visit your local DMV or Library for the free book to study. Engineering geologist.com free PDF

## 2023-11-08 NOTE — Plan of Care (Signed)
   Problem: Education: Goal: Verbalization of understanding the information provided will improve Outcome: Progressing   Problem: Activity: Goal: Interest or engagement in activities will improve Outcome: Progressing

## 2023-11-08 NOTE — [facility] Suicide Risk Assessment (Signed)
 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  World Science writer [facility]). Score between 0-7:  no or low risk or alcohol related problems. Score between 8-15:  moderate risk of alcohol related problems. Score between 16-19:  high risk of alcohol related problems. Score 20 or above:  warrants further diagnostic evaluation for alcohol dependence and treatment.   CLINICAL FACTORS:   Chronic Pain   Musculoskeletal: Strength & Muscle Tone: within normal limits Gait & Station: normal Patient leans: N/A  Psychiatric Specialty Exam:  Presentation  General Appearance:  Appropriate for Environment  Eye Contact: Fair  Speech: Clear and Coherent  Speech Volume: Normal  Handedness:No data recorded  Mood and Affect  Mood: Euthymic  Affect: Appropriate   Thought Process  Thought Processes: Coherent  Descriptions of Associations:Intact  Orientation:Full (Time, Place and Person)  Thought Content:WDL  History of Schizophrenia/Schizoaffective disorder:No data recorded Duration of Psychotic Symptoms:No data recorded Hallucinations:Hallucinations: None  Ideas of Reference:None  Suicidal Thoughts:Suicidal Thoughts: No  Homicidal  Thoughts:Homicidal Thoughts: No   Sensorium  Memory: Immediate Fair  Judgment: Fair  Insight: Fair   Art therapist  Concentration: Fair  Attention Span: Fair  Recall: [employer] of Knowledge: Fair  Language: Fair   Psychomotor Activity  Psychomotor Activity: Psychomotor Activity: Normal   Assets  Assets: Communication Skills; Desire for Improvement; Physical Health; Resilience   Sleep  Sleep: Sleep: Fair    Physical Exam: Physical Exam ROS Blood pressure 129/82, pulse 64, temperature 97.6 F (36.4 C), temperature source Oral, resp. rate 17, height 6\' 1"  (1.854 m), weight 66.7 kg, SpO2 100%. Body mass index is 19.39 kg/m.   COGNITIVE FEATURES THAT CONTRIBUTE TO RISK:  Polarized thinking    SUICIDE RISK:   Minimal: No identifiable suicidal ideation.  Patients presenting with no risk factors but with morbid ruminations; may be classified as minimal risk based on the severity of the depressive symptoms   Patient denies SI or HI for >48 hours. Denies wanting to be dead and future oriented. SRA complete and acute risk for suicide is low.   [place] Suicide Risk assessment:  1. Do you wish to be dead? NONE REPORTED 2. Have you wished your dead or wished you could go to sleep and not wake up? NONE REPORTED 3.  Have you actually had thoughts of killing yourself?  NONE REPORTED 4.  Have you been thinking about how you might do this?  NONE REPORTED 5.  Have you had these thoughts and some intention of acting on them? NONE REPORTED 6.  Have you started to work out or worked out the details to kill yourself? NONE REPORTED 7.  Do you intend to carry out this plan? NONE REPORTED 8. On a scale of 1-5 with 1 being the least severe and 5 being the most severe answer the following questions place for intensity of ideation. ZERO 9.  How many times have you had these thoughts? NONE REPORTED 10. When you have the thoughts how long to the last?  NONE  REPORTED 11. Control ability.  Could you or can you stop thinking about killing herself or wanting to die if you want to?  YES 12. Are there any things anyone or anything family religion pain of death that stop you from wanting to die or acting on thoughts of committing suicide?  FAMILY 13.  What sort of reason to do have to think about wanting to die or killing yourself? NONE REPORTED 14.Was it to end the pain or stop the way you are feeling in other words you could not go on living with his pain or how you are feeling or was not to get attention revenge or reaction from others?  Or both?  NONE REPORTED  [place] Suicide Risk assessment:  1. Do you wish to be dead? NONE REPORTED 2. Have you wished your dead or wished you could go to sleep and not wake up? NONE REPORTED 3.  Have you actually had thoughts of killing yourself?  NONE REPORTED 4.  Have you been thinking about how you might do this?  NONE REPORTED 5.  Have you had these thoughts and some intention of acting on them? NONE REPORTED 6.  Have you started to work out or worked out the details to kill yourself? NONE REPORTED 7.  Do you intend to carry out this plan? NONE REPORTED 8. On a scale of 1-5 with 1 being the least severe and 5 being the most severe answer the following questions place for intensity of ideation. ZERO 9. How many times have you had these thoughts? NONE REPORTED 10. When you have the thoughts how long to the last?  NONE REPORTED 11. Control ability.  Could you or can you stop thinking about killing herself or wanting to die if you want to?  YES 12. Are there any things anyone or anything family religion pain of death that stop you from wanting to die or acting on thoughts of committing suicide?  "too scared to do anything like that... I fear pain."  13.  What sort of reason to do have to think about wanting to die or killing yourself? NONE REPORTED 14.Was it to end the pain or stop the way you are feeling in other  words you could not go on living with his pain or how you are feeling or was not to get attention revenge or reaction from others?  Or both?  NONE REPORTED   PLAN OF CARE: outpatient psychiatric followup and referral for psychotherapy  I certify that inpatient services furnished can reasonably be expected to improve the patient's condition.   Darlette Dubow, MD 11/08/2023, 9:45 AM

## 2023-11-08 NOTE — Progress Notes (Signed)
   11/08/23 0900  Psych Admission Type (Psych Patients Only)  Admission Status Involuntary  Psychosocial Assessment  Patient Complaints Anxiety  Eye Contact Fair  Facial Expression Anxious  Affect Anxious  Speech Logical/coherent  Interaction Assertive  Motor Activity Fidgety  Appearance/Hygiene Unremarkable  Behavior Characteristics Cooperative  Mood Anxious  Thought Process  Coherency Circumstantial  Content WDL  Delusions None reported or observed  Perception WDL  Hallucination None reported or observed  Judgment Limited  Confusion None  Danger to Self  Current suicidal ideation? Denies  Danger to Others  Danger to Others None reported or observed

## 2023-11-08 NOTE — Discharge Summary (Signed)
 Physician Discharge Summary Note  Patient:  Phillip Dixon is an 23 y.o., male MRN:  [id number] DOB:  Jul 25, 2000 Patient phone:  [phone] (home)  Patient address:   [place] [address],  Total Time spent with patient: 30 minutes  Date of Admission:  11/05/2023 Date of Discharge: 11/08/23  Reason for Admission:  as per H&P: "23 y.o., male with unknown PPH (patient denies all previous psych history and no PMH.   PER evaluation in the ED by psychiatry yesterday: "On evaluation Phillip Dixon reports that he was not trying to harm himself, but states he was trying to "numb the pain" of his girlfriend breaking up with him. Patient girlfriend Phillip Dixon is here visiting patient. Patient becomes upset when this provider informs him, he is recommended for inpatient admission, based on the attempted overdose. Patient is focused on being discharged, and leaving the hospital, as he states he has a job he has to get back to. This provider discusses with him consequences of the actions we make, patient continues to say he just wanted to numb the pain, and states he takes Tylenol  all the time, but could not say why he was taking Tylenol . Patient states he is homeless, and he needs his job, he confirms he works part time at [employer]. He states his appetite and sleep are fair. Denies ever being admitted to an inpatient psychiatric facility, but states he has seen a therapist before (years ago) and was diagnosed with depression and ADHD. Patient states he does not take medication, and says when he took medication for ADHD it made him lose his appetite and he did not continue to take it.     Per Chart Review patient was seen in the ED in 07/2016 due to SI and HI thoughts. Patient has a past hx of cutting. Patient UDS positive for THC and BAL is 30 on admission. "       Collateral information obtained in ED yesterday by psych provider:  With patient permission, spoke with his girlfriend Phillip Dixon, she states she has known patient about 8 yrs since high school, says he has always been very hyper, irritable, trouble focusing, and depressed for a very long time. She agrees that patient needs help with mental health treatment. She states he has been homeless, she lives with her parents and he sleeps in her car, and he drinks alcohol a lot, while taking over the counter pain medications, and THC. She states he was raised by his paternal aunt Phillip Dixon, his biological mother lives in [place], and his bio father lives here in [place] in a boarding house. She states that patients family does not believe in mental health help or treatment. She states the only real support patient had in his family was his paternal grandfather who passed away when patient was 66 yrs old. She states he face timed her last night, and was walking in traffic after she broke up with him, because she states they need to focus on self, and get individual help, she feels she has enabled him and he wont let her break up with him.           Patient seen for treatment team with social worker and is demanding stating "I got drunk, I've had 8 teeth removed, and I'm homeless." States he is not suicidal or homicidal. Patient continues to minimize how he ended up in the hospital but admits his ex-girlfriend and cousin called the  police after he called them  intoxicated on the phone and they were concerned about him.   Patient states his ex-girlfriend continues to be his biggest support and that he is still convinced they will get back together. Patient gave me permission to speak with her: Phillip Dixon [phone]). I spoke with patient's girlfriend who confirms patient called her and sounded drunk and was making vague statements about "my pain going away" and they were concerned and called the police. Reports patient has chronic difficulty regulating mood and dealing with stressor in a healthy way with recent stressor being  homelessness, and her breaking up with him. Patient refuses to accept that she is breaking up with him. States patient has made passive statements of "going away" before but has not said he was thinking about killing himself or stated he had any plan to do so. Denies any knowledge of prior suicide attempts. She believes he needs to see a therapist to develop healthier coping skills.    Patient denies drinking most days, denies history of withdrawal. DENies history of DUIs or rehab. States he drank several shots of Tequila that day in the parking lot outside work and was drunk. States he does not remember much after that but denies being suicidal. Does admit to multiple stressors including homelessness, sleeping in cousins car and working at [employer] as well as girlfriend breaking up with him. Denies SI, HI or AVH. Denies feeling depressed or anxious. Admits to cannabis use however "for stress." Denies history of manic episodes or psychosis. 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 No pertinent family history. Family Psychiatric  History:  reports mother with possible depression but denies history of suicide attempts Tobacco Screening:  Social History:  Social History   Substance and Sexual Activity  Alcohol Use Yes     Social History   Substance  and Sexual Activity  Drug Use Yes   Types: Marijuana    Social History   Socioeconomic History   Marital status: Single    Spouse name: Not on file   Number of children: Not on file   Years of education: Not on file   Highest education level: Not on file  Occupational History   Not on file  Tobacco Use   Smoking status: Some Days    Types: Cigarettes, Cigars   Smokeless tobacco: Never  Vaping Use   Vaping status: Every Day  Substance and Sexual Activity   Alcohol use: Yes   Drug use: Yes    Types: Marijuana   Sexual activity: Never  Other Topics Concern   Not on file  Social History Narrative   Not on file   Social Drivers of Health   Financial Resource Strain: Not on file  Food Insecurity: Food Insecurity Present (11/05/2023)   Hunger Vital Sign    Worried About Running Out of Food in the Last Year: Sometimes true    Ran  Out of Food in the Last Year: Sometimes true  Transportation Needs: No Transportation Needs (11/05/2023)   PRAPARE - Administrator, Civil Service (Medical): No    Lack of Transportation (Non-Medical): No  Physical Activity: Not on file  Stress: Not on file  Social Connections: Not on file    Hospital Course:   Patient was admitted to inpatient psychiatry at [facility] for safety and stabilization. Patient was provided safe and therapeutic milieu, psychiatric and medical assessment, care and treatment, as well as support from nursing, behavioral health staff. Both psychotherapy and psychoeducation groups were provided. Different coping skills such as journaling, CBT and art therapy groups were offered. Additional consultation was provided by hospitalist for H&P and medical needs.  Patient was started on mirtazapine  during the admission for MDD and generalized anxiety disorder. Patient denied symptoms of depresion throughout admission however girlfriend noted symptoms of MDD. Patient was agreeable to trial of mirtazapine  for GAD. Patient was  additionally started on guanfacine  for ADHD and impulsivity and tolerated well. Patient decline naltrexone throughout admission stating he will stop drinking alcohol and we discussed extensively that the primary reason for his presentation to the hospital was due to alcohol intoxication with some underlying depressive symptoms. Patient tolerated without side effects and medications were titrated to therapeutic effect. As patient stabilized on medications and participated in therapeutic interventions, symptoms began to improve. Patient denied SI or HI throughout admission. Denied wanting to be dead and was future oriented on returning to work at [employer] as well as getting his driver's license. Patient repeatedly told me when asked about suicidal thoughts  "I'm too scared to do anything like that... I fear pain."   On the day of discharge, the chart was reviewed, case was discussed with staff and the patient was seen in person. Patient's overall mood has improved. Patient was calm and cooperative and did not appear anxious. Patient reports "good" mood and states his ex-girlfriend will pick him up.  Patient reported adequate sleep and stable mood. Patient was tolerating medications well without side effects reported or noted. Patient denied suicidal ideation, plan or intent, denied hopelessness, helplessness or worthlessness, and denied homicidal ideation. Insight and judgement have improved. Patient demonstrated future orientation and was motivated to follow-up with aftercare. Patient was encouraged to be adherent with medications. Patient was instructed to call 911, ask for help to go to the closest emergency room or crisis center, call crisis hotlines for help if in critical status or when symptoms were worsening. Patient voiced understanding of this information. At the time of discharge, patient had reached maximum benefit from hospitalization, was no longer considered to be dangerous to self or others, and was  psychiatrically stable and otherwise appropriate for discharge to less restrictive care in the community.   Medical Hospital Course: Patient was seen by the hospitalist for routine admission examination. Medications for chronic conditions were continued.  Medical hospital course was otherwise unremarkable.   Musculoskeletal: Strength & Muscle Tone: within normal limits Gait & Station: normal Patient leans: N/A   Psychiatric Specialty Exam:  Presentation  General Appearance:  Appropriate for Environment  Eye Contact: Fair  Speech: Clear and Coherent  Speech Volume: Normal  Handedness:No data recorded  Mood and Affect  Mood: Euthymic  Affect: Appropriate   Thought Process  Thought Processes: Coherent  Descriptions of Associations:Intact  Orientation:Full (Time, Place and Person)  Thought Content:WDL  History of Schizophrenia/Schizoaffective disorder:No data recorded Duration of Psychotic Symptoms:No data recorded Hallucinations:Hallucinations: None  Ideas of Reference:None  Suicidal Thoughts:Suicidal Thoughts: No  Homicidal Thoughts:Homicidal Thoughts: No   Sensorium  Memory: Immediate Fair  Judgment: Fair  Insight: Fair   Chartered certified accountant: Fair  Attention Span: Fair  Recall: [employer] of Knowledge: Fair  Language: Fair   Psychomotor Activity  Psychomotor Activity: Psychomotor Activity: Normal   Assets  Assets: Manufacturing systems engineer; Desire for Improvement; Physical Health; Resilience   Sleep  Sleep: Sleep: Fair    Physical Exam: Physical exam: Please see exam on admit note. General: Well developed, well nourished.  Pupils: Normal at 3mm Respiratory: Breathing is unlabored.  Cardiovascular: No edema.  Language: No anomia, no aphasia Muscle strength and tone-pt moving all extremities.  Gait not assessed as pt remained in bed.  Neuro: Facial muscles are symmetric. Pt without tremor, no evidence  of hyperarousal.  Review of Systems  Constitutional: Negative.   HENT: Negative.    Eyes: Negative.   Respiratory: Negative.    Cardiovascular: Negative.   Gastrointestinal: Negative.   Genitourinary: Negative.   Musculoskeletal: Negative.   Skin: Negative.   Neurological: Negative.   Endo/Heme/Allergies: Negative.   Psychiatric/Behavioral: Negative.     Blood pressure 129/82, pulse 64, temperature 97.6 F (36.4 C), temperature source Oral, resp. rate 17, height 6\' 1"  (1.854 m), weight 66.7 kg, SpO2 100%. Body mass index is 19.39 kg/m.   Social History   Tobacco Use  Smoking Status Some Days   Types: Cigarettes, Cigars  Smokeless Tobacco Never   Tobacco Cessation:  N/A, patient does not currently use tobacco products   Blood Alcohol level:  Lab Results  Component Value Date   ETH 30 (H) 11/05/2023   ETH <5 08/08/2016    Metabolic Disorder Labs:  No results found for: "HGBA1C", "MPG" No results found for: "PROLACTIN" No results found for: "CHOL", "TRIG", "HDL", "CHOLHDL", "VLDL", "LDLCALC"  See Psychiatric Specialty Exam and Suicide Risk Assessment completed by Attending Physician prior to discharge.  Discharge destination:  Home  Is patient on multiple antipsychotic therapies at discharge:  No   Has Patient had three or more failed trials of antipsychotic monotherapy by history:  No  Recommended Plan for Multiple Antipsychotic Therapies: NA   Allergies as of 11/08/2023   No Known Allergies      Medication List     STOP taking these medications    HYDROcodone-acetaminophen  5-325 MG tablet Commonly known as: NORCO/VICODIN   ibuprofen 200 MG tablet Commonly known as: ADVIL       TAKE these medications      Indication  guanFACINE  1 MG Tb24 ER tablet Commonly known as: INTUNIV  Take 1 tablet (1 mg total) by mouth daily. Start taking on: November 09, 2023  Indication: Attention Deficit Hyperactivity Disorder   mirtazapine  15 MG disintegrating  tablet Commonly known as: REMERON  SOL-TAB Take 1 tablet (15 mg total) by mouth at bedtime.  Indication: generalized anxiety disorder   traZODone  50 MG tablet Commonly known as: DESYREL  Take 1 tablet (50 mg total) by mouth at bedtime as needed for sleep.  Indication: Trouble Sleeping        Follow-up Information     [facility] Follow up on 11/18/2023.   Specialty: Behavioral Health Why: You have an appointment for medication management services on 11/18/23 at 11:00 am, Virtual.  You have an appointment for therapy services on 12/24/23 at 8:00 am with Dallie Duel, Virtual, (they will text you the link for this appointment). Contact information:  [address] [place]  [address] [phone]                Follow-up recommendations:  Other:  outpatient psychiatric followup as above    Signed: Mya Suell, MD 11/08/2023, 9:46 AM

## 2023-11-18 ENCOUNTER — Encounter (HOSPITAL_COMMUNITY): Payer: Self-pay

## 2023-11-18 ENCOUNTER — Ambulatory Visit (HOSPITAL_COMMUNITY): Admitting: Psychiatry

## 2023-12-08 ENCOUNTER — Telehealth: Payer: Self-pay

## 2023-12-08 NOTE — Telephone Encounter (Signed)
 Copied from CRM [id number]. Topic: General - Other >> Dec 08, 2023 10:35 AM [name] wrote: Reason for CRM: Phillip Dixon with [employer], no contact with 3 phone attempts and a letter. Discharge appointment on 11/18/2023, and missed the appointment. Callback number is [phone] ext [phone]  Lvm for call back. KH

## 2023-12-24 ENCOUNTER — Encounter (HOSPITAL_COMMUNITY): Payer: Self-pay

## 2023-12-24 ENCOUNTER — Ambulatory Visit (HOSPITAL_COMMUNITY): Payer: Self-pay | Admitting: Licensed Clinical Social Worker

## 2023-12-24 DIAGNOSIS — Z91199 Patient's noncompliance with other medical treatment and regimen due to unspecified reason: Secondary | ICD-10-CM

## 2023-12-24 NOTE — Progress Notes (Signed)
 No show

## 2024-04-10 ENCOUNTER — Ambulatory Visit
Admission: EM | Admit: 2024-04-10 | Discharge: 2024-04-10 | Disposition: A | Discharge status: Home / Self Care | Attending: Family Medicine | Admitting: Family Medicine

## 2024-04-10 DIAGNOSIS — R5381 Other malaise: Secondary | ICD-10-CM

## 2024-04-10 DIAGNOSIS — B349 Viral infection, unspecified: Secondary | ICD-10-CM

## 2024-04-10 DIAGNOSIS — R5383 Other fatigue: Secondary | ICD-10-CM | POA: Diagnosis not present

## 2024-04-10 NOTE — ED Triage Notes (Signed)
 Pt c/o no energy and feeling weak started yesterday-NAD-steady gait

## 2024-04-10 NOTE — ED Notes (Signed)
 Pt refused nasal swab for covid testing-PA C notified

## 2024-04-10 NOTE — ED Provider Notes (Signed)
 [facility]  Note:  This document was prepared using Conservation officer, historic buildings and may include unintentional dictation errors.  MRN: [id number] DOB: 2000/12/20  Subjective:   Phillip Dixon is a 23 y.o. male presenting for 1 day history of malaise, fatigue, runny and stuffy nose. Has had cold symptoms. Wants a note for work. No chest pain, shob, wheezing, body aches. Refused testing.   No current facility-administered medications for this encounter.  Current Outpatient Medications:    guanFACINE  (INTUNIV ) 1 MG TB24 ER tablet, Take 1 tablet (1 mg total) by mouth daily., Disp: 30 tablet, Rfl: 0   mirtazapine  (REMERON  SOL-TAB) 15 MG disintegrating tablet, Take 1 tablet (15 mg total) by mouth at bedtime., Disp: 30 tablet, Rfl: 0   traZODone  (DESYREL ) 50 MG tablet, Take 1 tablet (50 mg total) by mouth at bedtime as needed for sleep., Disp: 30 tablet, Rfl: 0   No Known Allergies  Past Medical History:  Diagnosis Date   ADHD (attention deficit hyperactivity disorder)    Asthma    Oppositional defiant disorder      Past Surgical History:  Procedure Laterality Date   tonsillectomy      No family history on file.  Social History   Tobacco Use   Smoking status: Some Days    Types: Cigarettes, Cigars   Smokeless tobacco: Never  Vaping Use   Vaping status: Every Day  Substance Use Topics   Alcohol use: Yes    Comment: occ   Drug use: Yes    Types: Marijuana    ROS   Objective:   Vitals: BP 118/73 (BP Location: Left Arm)   Pulse 69   Temp 97.8 F (36.6 C) (Oral)   Resp 16   SpO2 96%   Physical Exam Constitutional:      General: He is not in acute distress.    Appearance: Normal appearance. He is well-developed and normal weight. He is not ill-appearing, toxic-appearing or diaphoretic.  HENT:     Head: Normocephalic and atraumatic.     Right Ear: External ear normal.     Left Ear: External ear normal.     Nose: Nose normal.      Mouth/Throat:     Mouth: Mucous membranes are moist.     Pharynx: No pharyngeal swelling, oropharyngeal exudate, posterior oropharyngeal erythema or uvula swelling.     Tonsils: No tonsillar exudate or tonsillar abscesses. 0 on the right. 0 on the left.  Eyes:     General: No scleral icterus.       Right eye: No discharge.        Left eye: No discharge.     Extraocular Movements: Extraocular movements intact.  Cardiovascular:     Rate and Rhythm: Normal rate and regular rhythm.     Heart sounds: Normal heart sounds. No murmur heard.    No friction rub. No gallop.  Pulmonary:     Effort: Pulmonary effort is normal. No respiratory distress.     Breath sounds: Normal breath sounds. No stridor. No wheezing, rhonchi or rales.  Musculoskeletal:     Cervical back: Normal range of motion.  Neurological:     Mental Status: He is alert and oriented to person, place, and time.  Psychiatric:        Mood and Affect: Mood normal.        Behavior: Behavior normal.        Thought Content: Thought content normal.  Judgment: Judgment normal.     Assessment and Plan :   PDMP not reviewed this encounter.  1. Acute viral syndrome   2. Malaise and fatigue    Note for his work provided.  Suspect viral URI, viral syndrome. Physical exam findings reassuring and vital signs stable for discharge. Advised supportive care, offered symptomatic relief. Counseled patient on potential for adverse effects with medications prescribed/recommended today, ER and return-to-clinic precautions discussed, patient verbalized understanding.     Christopher Savannah, [place] 04/10/24 [phone]

## 2024-04-10 NOTE — Discharge Instructions (Addendum)
We will manage this as a viral illness. For sore throat or cough try using a honey-based tea. Use 3 teaspoons of honey with juice squeezed from half lemon. Place shaved pieces of ginger into 1/2-1 cup of water and warm over stove top. Then mix the ingredients and repeat every 4 hours as needed. Please take ibuprofen 671m every 6 hours with food alternating with OR taken together with Tylenol 5019m650mg every 6 hours for throat pain, fevers, aches and pains. Hydrate very well with at least 2 liters of water. Eat light meals such as soups (chicken and noodles, vegetable, chicken and wild rice).  Do not eat foods that you are allergic to.  Taking an antihistamine like Zyrtec (1017maily) can help against postnasal drainage, sinus congestion which can cause sinus pain, sinus headaches, throat pain, painful swallowing, coughing.  You can take this together with pseudoephedrine (Sudafed) at a dose of 30-60 mg 3 times a day or twice daily as needed for the same kind of nasal drip, congestion.

## 2024-06-01 ENCOUNTER — Other Ambulatory Visit: Payer: Self-pay

## 2024-06-01 ENCOUNTER — Ambulatory Visit
Admission: EM | Admit: 2024-06-01 | Discharge: 2024-06-01 | Disposition: A | Discharge status: Home / Self Care | Attending: Nurse Practitioner | Admitting: Nurse Practitioner

## 2024-06-01 DIAGNOSIS — A084 Viral intestinal infection, unspecified: Secondary | ICD-10-CM | POA: Diagnosis not present

## 2024-06-01 MED ORDER — ONDANSETRON 4 MG PO TBDP
4.0000 mg | ORAL_TABLET | Freq: Once | ORAL | Status: AC
  Administered 2024-06-01: 4 mg via ORAL

## 2024-06-01 MED ORDER — ONDANSETRON 4 MG PO TBDP: 4.0000 mg | ORAL_TABLET | Freq: Three times a day (TID) | ORAL | 0 refills | Status: AC | PRN

## 2024-06-01 NOTE — Discharge Instructions (Signed)
 You may take Zofran  every 8 hours as needed for nausea or vomiting.  You were given a dose while in the clinic so you can take your next dose at 9 PM today.  Focus on hydration/electrolyte replacement with Gatorade, Powerade, Pedialyte, water .  Bland diet and advance as you tolerate.  Please follow-up with your PCP if your symptoms do not improve and please go to the ER if you have any worsening symptoms or unable to stay hydrated.  I hope you feel better soon!

## 2024-06-01 NOTE — ED Triage Notes (Signed)
 Pt c/o loss of appetite, N/Vx3d since he ate food 3 days ago from a church

## 2024-06-01 NOTE — ED Provider Notes (Signed)
 [facility]    CSN: [id number] Arrival date & time: 06/01/24  1145      History   Chief Complaint Chief Complaint  Patient presents with   Emesis   Nausea    HPI Phillip Dixon is a 23 y.o. male  presents for evaluation of URI symptoms for 3 days. Patient reports associated symptoms of nausea with vomiting x 3 days after eating at a church function.  States he does not know if anyone else became sick after eating.  Denies diarrhea, abdominal pain, fevers, URI symptoms.  States he can keep sips of fluids down and reports normal urination but has not been able to keep any food down.  Denies any weight loss.  Denies history of GI diagnoses such as Crohn's, IBS, colitis, GERD.  No known sick contacts.  No travel.  Has not taken any OTC treatments for symptoms.  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 Authorizing Provider  ondansetron  (ZOFRAN -ODT) 4 MG disintegrating tablet Take 1 tablet (4 mg total) by mouth every 8 (eight) hours as needed for nausea or vomiting. 06/01/24  Yes Zykera Abella, Jodi R, NP  guanFACINE  (INTUNIV ) 1 MG TB24 ER tablet Take 1 tablet (1 mg total) by mouth daily.  11/09/23   Zouev, Dmitri, MD  mirtazapine  (REMERON  SOL-TAB) 15 MG disintegrating tablet Take 1 tablet (15 mg total) by mouth at bedtime. 11/08/23   Zouev, Dmitri, MD  traZODone  (DESYREL ) 50 MG tablet Take 1 tablet (50 mg total) by mouth at bedtime as needed for sleep. 11/08/23   Zouev, Dmitri, MD    Family History History reviewed. No pertinent family history.  Social History Social History   Tobacco Use   Smoking status: Some Days    Types: Cigarettes, Cigars   Smokeless tobacco: Never  Vaping Use   Vaping status: Every Day  Substance Use Topics   Alcohol use: Yes    Comment: occ   Drug use: Yes    Types: Marijuana     Allergies   Patient has no known allergies.   Review of Systems Review of Systems  Gastrointestinal:  Positive for nausea and vomiting. Negative for abdominal pain and diarrhea.     Physical Exam Triage Vital Signs ED Triage Vitals  Encounter Vitals Group     BP 06/01/24 1310 112/70     Girls Systolic BP Percentile --      Girls Diastolic BP Percentile --      Boys Systolic BP Percentile --      Boys Diastolic BP Percentile --      Pulse Rate 06/01/24 1310 (!) 47     Resp  06/01/24 1310 16     Temp 06/01/24 1310 97.9 F (36.6 C)     Temp Source 06/01/24 1310 Oral     SpO2 06/01/24 1310 98 %     Weight --      Height --      Head Circumference --      Peak Flow --      Pain Score 06/01/24 1308 0     Pain Loc --      Pain Education --      Exclude from Growth Chart --    No data found.  Updated Vital Signs BP 112/70   Pulse (!) 47   Temp 97.9 F (36.6 C) (Oral)   Resp 16   SpO2 98%   Visual Acuity Right Eye Distance:   Left Eye Distance:   Bilateral Distance:    Right Eye Near:   Left Eye Near:    Bilateral Near:     Physical Exam Vitals and nursing note reviewed.  Constitutional:      General: He is not in acute distress.    Appearance: Normal appearance. He is not ill-appearing, toxic-appearing or diaphoretic.  HENT:      Head: Normocephalic and atraumatic.  Eyes:     Pupils: Pupils are equal, round, and reactive to light.  Cardiovascular:     Rate and Rhythm: Bradycardia present.  Pulmonary:     Effort: Pulmonary effort is normal.  Abdominal:     General: Bowel sounds are normal. There is no distension.     Palpations: Abdomen is soft.     Tenderness: There is no abdominal tenderness. There is no guarding or rebound.  Neurological:     General: No focal deficit present.     Mental Status: He is alert and oriented to person, place, and time.  Psychiatric:        Mood and Affect: Mood normal.        Behavior: Behavior normal.      UC Treatments / Results  Labs (all labs ordered are listed, but only abnormal results are displayed) Labs Reviewed - No data to display  EKG   Radiology No results found.  Procedures Procedures (including critical care time)  Medications Ordered in UC Medications  ondansetron  (ZOFRAN -ODT) disintegrating tablet 4 mg (4 mg Oral Given 06/01/24 1321)    Initial Impression / Assessment and Plan / UC Course  I have reviewed the triage vital signs and the nursing notes.  Pertinent labs & imaging results that were available during my care of the patient were reviewed by me and considered in my medical decision making (see chart for details).     Discussed likely viral enteritis and symptomatic treatment.  Given Zofran  in clinic and Rx into pharmacy.  Discussed BRAT diet/fluid replacement/electrolytes.  PCP follow-up if symptoms do not improve.  ER precautions reviewed. Final Clinical Impressions(s) / UC Diagnoses   Final diagnoses:  Viral enteritis     Discharge Instructions      You may take Zofran  every 8 hours as needed for nausea or vomiting.  You were given a dose while in the clinic so you can take your next dose at 9 PM today.  Focus on hydration/electrolyte replacement with Gatorade, Powerade, Pedialyte, water .  Bland diet and advance as you tolerate.   Please follow-up with your PCP if your symptoms do not improve and please go to the ER if you have any worsening symptoms or unable to stay hydrated.  I hope  you feel better soon!    ED Prescriptions     Medication Sig Dispense Auth. Provider   ondansetron  (ZOFRAN -ODT) 4 MG disintegrating tablet Take 1 tablet (4 mg total) by mouth every 8 (eight) hours as needed for nausea or vomiting. 10 tablet Mandrell Vangilder, Jodi R, NP      PDMP not reviewed this encounter.   Loreda Myla SAUNDERS, NP 06/01/24 1322
